# Patient Record
Sex: Male | Born: 1950
Health system: Southern US, Community
[De-identification: ages and names within clinical notes are randomized; demographics above are authoritative.]

## PROBLEM LIST (undated history)

## (undated) DIAGNOSIS — M1990 Unspecified osteoarthritis, unspecified site: Secondary | ICD-10-CM

## (undated) DIAGNOSIS — T7840XA Allergy, unspecified, initial encounter: Secondary | ICD-10-CM

## (undated) HISTORY — PX: OTHER SURGICAL HISTORY: SHX169

## (undated) HISTORY — DX: Unspecified osteoarthritis, unspecified site: M19.90

## (undated) HISTORY — DX: Allergy, unspecified, initial encounter: T78.40XA

---

## 2007-12-19 ENCOUNTER — Emergency Department (HOSPITAL_COMMUNITY): Admission: EM | Admit: 2007-12-19 | Discharge: 2007-12-19 | Payer: Self-pay | Admitting: Family Medicine

## 2009-10-16 HISTORY — PX: APPENDECTOMY: SHX54

## 2010-07-07 ENCOUNTER — Inpatient Hospital Stay (HOSPITAL_COMMUNITY): Admission: EM | Admit: 2010-07-07 | Discharge: 2010-07-10 | Payer: Self-pay | Admitting: Emergency Medicine

## 2010-07-07 ENCOUNTER — Encounter (INDEPENDENT_AMBULATORY_CARE_PROVIDER_SITE_OTHER): Payer: Self-pay | Admitting: Surgery

## 2010-11-06 ENCOUNTER — Encounter: Payer: Self-pay | Admitting: Surgery

## 2010-12-29 LAB — CBC
Hemoglobin: 12.8 g/dL — ABNORMAL LOW (ref 13.0–17.0)
Hemoglobin: 15 g/dL (ref 13.0–17.0)
MCH: 31.3 pg (ref 26.0–34.0)
MCH: 31.9 pg (ref 26.0–34.0)
MCHC: 35.1 g/dL (ref 30.0–36.0)
Platelets: 148 10*3/uL — ABNORMAL LOW (ref 150–400)
Platelets: 159 10*3/uL (ref 150–400)
Platelets: 181 10*3/uL (ref 150–400)
RBC: 3.9 MIL/uL — ABNORMAL LOW (ref 4.22–5.81)
RBC: 4.09 MIL/uL — ABNORMAL LOW (ref 4.22–5.81)
RBC: 4.79 MIL/uL (ref 4.22–5.81)
WBC: 11.9 10*3/uL — ABNORMAL HIGH (ref 4.0–10.5)
WBC: 24.7 10*3/uL — ABNORMAL HIGH (ref 4.0–10.5)

## 2010-12-29 LAB — COMPREHENSIVE METABOLIC PANEL
ALT: 32 U/L (ref 0–53)
AST: 26 U/L (ref 0–37)
Albumin: 4 g/dL (ref 3.5–5.2)
Alkaline Phosphatase: 55 U/L (ref 39–117)
CO2: 29 mEq/L (ref 19–32)
Chloride: 98 mEq/L (ref 96–112)
Creatinine, Ser: 1.61 mg/dL — ABNORMAL HIGH (ref 0.4–1.5)
GFR calc Af Amer: 53 mL/min — ABNORMAL LOW (ref >60)
GFR calc non Af Amer: 44 mL/min — ABNORMAL LOW (ref >60)
Potassium: 3.3 mEq/L — ABNORMAL LOW (ref 3.5–5.1)
Total Bilirubin: 1 mg/dL (ref 0.3–1.2)

## 2010-12-29 LAB — URINALYSIS, ROUTINE W REFLEX MICROSCOPIC
Ketones, ur: 15 mg/dL — AB
Nitrite: NEGATIVE
Specific Gravity, Urine: 1.021 (ref 1.005–1.030)

## 2010-12-29 LAB — DIFFERENTIAL
Basophils Absolute: 0 10*3/uL (ref 0.0–0.1)
Basophils Relative: 0 % (ref 0–1)
Basophils Relative: 0 % (ref 0–1)
Eosinophils Absolute: 0 10*3/uL (ref 0.0–0.7)
Eosinophils Absolute: 0 10*3/uL (ref 0.0–0.7)
Lymphocytes Relative: 8 % — ABNORMAL LOW (ref 12–46)
Lymphs Abs: 0.5 10*3/uL — ABNORMAL LOW (ref 0.7–4.0)
Lymphs Abs: 1 10*3/uL (ref 0.7–4.0)
Monocytes Absolute: 0.2 10*3/uL (ref 0.1–1.0)
Neutro Abs: 10.4 10*3/uL — ABNORMAL HIGH (ref 1.7–7.7)
Neutro Abs: 17.6 10*3/uL — ABNORMAL HIGH (ref 1.7–7.7)
Neutro Abs: 24 10*3/uL — ABNORMAL HIGH (ref 1.7–7.7)
Neutrophils Relative %: 88 % — ABNORMAL HIGH (ref 43–77)
Neutrophils Relative %: 97 % — ABNORMAL HIGH (ref 43–77)

## 2010-12-29 LAB — TYPE AND SCREEN
ABO/RH(D): A POS
Antibody Screen: NEGATIVE

## 2010-12-29 LAB — URINE MICROSCOPIC-ADD ON

## 2010-12-29 LAB — ANAEROBIC CULTURE

## 2010-12-29 LAB — ABO/RH: ABO/RH(D): A POS

## 2010-12-29 LAB — BASIC METABOLIC PANEL
CO2: 27 mEq/L (ref 19–32)
Calcium: 8.9 mg/dL (ref 8.4–10.5)
Creatinine, Ser: 1.29 mg/dL (ref 0.4–1.5)
GFR calc Af Amer: >60 mL/min (ref >60)
GFR calc non Af Amer: 57 mL/min — ABNORMAL LOW (ref >60)
Sodium: 139 mEq/L (ref 135–145)

## 2010-12-29 LAB — CULTURE, ROUTINE-ABSCESS

## 2010-12-29 LAB — PROTIME-INR: INR: 1.04 (ref 0.00–1.49)

## 2011-08-31 ENCOUNTER — Encounter: Payer: Self-pay | Admitting: Gastroenterology

## 2011-09-06 ENCOUNTER — Encounter: Payer: Self-pay | Admitting: Gastroenterology

## 2011-09-06 ENCOUNTER — Ambulatory Visit (AMBULATORY_SURGERY_CENTER): Payer: PRIVATE HEALTH INSURANCE | Admitting: *Deleted

## 2011-09-06 VITALS — Ht 68.0 in | Wt 218.1 lb

## 2011-09-06 DIAGNOSIS — Z1211 Encounter for screening for malignant neoplasm of colon: Secondary | ICD-10-CM

## 2011-09-06 MED ORDER — MOVIPREP 100 G PO SOLR: 1.0000 | Freq: Once | ORAL | Status: DC

## 2011-09-22 ENCOUNTER — Ambulatory Visit (AMBULATORY_SURGERY_CENTER): Payer: PRIVATE HEALTH INSURANCE | Admitting: Gastroenterology

## 2011-09-22 ENCOUNTER — Encounter: Payer: Self-pay | Admitting: Gastroenterology

## 2011-09-22 VITALS — BP 140/81 | HR 80 | Temp 97.6°F | Resp 21 | Ht 68.0 in | Wt 218.0 lb

## 2011-09-22 DIAGNOSIS — Z1211 Encounter for screening for malignant neoplasm of colon: Secondary | ICD-10-CM

## 2011-09-22 DIAGNOSIS — D126 Benign neoplasm of colon, unspecified: Secondary | ICD-10-CM

## 2011-09-22 MED ORDER — SODIUM CHLORIDE 0.9 % IV SOLN: 500.0000 mL | INTRAVENOUS | Status: DC

## 2011-09-22 NOTE — Patient Instructions (Signed)
Please follow all discharge instructions given to you by the recovery room nurse. If you have any questions or problems after discharge please call [phone]. You will receive a phone call in the am to see how you are doing and answer any questions you may have. Thank you for choosing [facility] for your health care needs.  

## 2011-09-22 NOTE — Progress Notes (Signed)
Pt tolerated the colonoscopy very well. maw 

## 2011-09-22 NOTE — Progress Notes (Signed)
Patient did not experience any of the following events: a burn prior to discharge; a fall within the facility; wrong site/side/patient/procedure/implant event; or a hospital transfer or hospital admission upon discharge from the facility. (G8907) Patient did not have preoperative order for IV antibiotic SSI prophylaxis. (G8918)  

## 2011-09-22 NOTE — Op Note (Signed)
[facility] [address] [place], [place]  [address]  COLONOSCOPY PROCEDURE REPORT  PATIENT:  Stanley, Estrada  MR#:  [id number] BIRTHDATE:  1951/03/27, 60 yrs. old  GENDER:  male ENDOSCOPIST:  Rachael Fee, MD PROCEDURE DATE:  09/22/2011 PROCEDURE:  Colonoscopy with snare polypectomy ASA CLASS:  Class II INDICATIONS:  Routine Risk Screening MEDICATIONS:   Fentanyl 50 mcg IV, These medications were titrated to patient response per physician's verbal order, Versed 6 mg IV  DESCRIPTION OF PROCEDURE:   After the risks benefits and alternatives of the procedure were thoroughly explained, informed consent was obtained.  Digital rectal exam was performed and revealed no rectal masses.   The LB [id number] endoscope was introduced through the anus and advanced to the cecum, which was identified by both the appendix and ileocecal valve, without limitations.  The quality of the prep was good..  The instrument was then slowly withdrawn as the colon was fully examined. <<PROCEDUREIMAGES>> FINDINGS:  Three polyps were found. Two were sessile, 2-106mm across, removed with cold snare, sent to pathology (jar 1), located in transverse segment. One was 10mm, pedunculated, removed with snare/cautery, sent to pathology (jar 2), located in distal sigmoid segment (see image4, image7, and image10).  This was otherwise a normal examination of the colon (see image11, image2, and image3).   Retroflexed views in the rectum revealed no abnormalities. COMPLICATIONS:  None  ENDOSCOPIC IMPRESSION: 1) Three polyps.  All were removed and all were sent to pathology 2) Otherwise normal examination  RECOMMENDATIONS: 1) If the polyp(s) removed today are proven to be adenomatous (pre-cancerous) polyps, you will need a colonoscopy in 3 years. Otherwise you should continue to follow colorectal cancer screening guidelines for "routine risk" patients with a colonoscopy in 10  years.  ______________________________ Rachael Fee, MD  n. eSIGNED:   Rachael Fee at 09/22/2011 11:23 AM  Floreen Comber, [phone]

## 2011-09-25 ENCOUNTER — Telehealth: Payer: Self-pay | Admitting: *Deleted

## 2011-09-25 NOTE — Telephone Encounter (Signed)
No answer, no voicemail on # pt left in adm. For [place] to call back.

## 2011-09-29 ENCOUNTER — Ambulatory Visit (INDEPENDENT_AMBULATORY_CARE_PROVIDER_SITE_OTHER): Payer: PRIVATE HEALTH INSURANCE | Admitting: Family Medicine

## 2011-09-29 ENCOUNTER — Encounter: Payer: Self-pay | Admitting: Family Medicine

## 2011-09-29 DIAGNOSIS — Z9109 Other allergy status, other than to drugs and biological substances: Secondary | ICD-10-CM

## 2011-09-29 DIAGNOSIS — K219 Gastro-esophageal reflux disease without esophagitis: Secondary | ICD-10-CM

## 2011-09-29 DIAGNOSIS — Z0000 Encounter for general adult medical examination without abnormal findings: Secondary | ICD-10-CM

## 2011-09-29 DIAGNOSIS — J309 Allergic rhinitis, unspecified: Secondary | ICD-10-CM

## 2011-09-29 MED ORDER — CETIRIZINE HCL 10 MG PO CAPS: 10.0000 mg | ORAL_CAPSULE | Freq: Every day | ORAL | Status: DC

## 2011-09-29 MED ORDER — OMEPRAZOLE 20 MG PO CPDR: 20.0000 mg | DELAYED_RELEASE_CAPSULE | Freq: Every day | ORAL | Status: DC

## 2011-09-29 NOTE — Patient Instructions (Signed)
Allergies, Generic Allergies may happen from anything your body is sensitive to. This may be food, medicines, pollens, chemicals, and nearly anything around you in everyday life that produces allergens. An allergen is anything that causes an allergy producing substance. Heredity is often a factor in causing these problems. This means you may have some of the same allergies as your parents. Food allergies happen in all age groups. Food allergies are some of the most severe and life threatening. Some common food allergies are cow's milk, seafood, eggs, nuts, wheat, and soybeans. SYMPTOMS   Swelling around the mouth.   An itchy red rash or hives.   Vomiting or diarrhea.   Difficulty breathing.  SEVERE ALLERGIC REACTIONS ARE LIFE-THREATENING. This reaction is called anaphylaxis. It can cause the mouth and throat to swell and cause difficulty with breathing and swallowing. In severe reactions only a trace amount of food (for example, peanut oil in a salad) may cause death within seconds. Seasonal allergies occur in all age groups. These are seasonal because they usually occur during the same season every year. They may be a reaction to molds, grass pollens, or tree pollens. Other causes of problems are house dust mite allergens, pet dander, and mold spores. The symptoms often consist of nasal congestion, a runny itchy nose associated with sneezing, and tearing itchy eyes. There is often an associated itching of the mouth and ears. The problems happen when you come in contact with pollens and other allergens. Allergens are the particles in the air that the body reacts to with an allergic reaction. This causes you to release allergic antibodies. Through a chain of events, these eventually cause you to release histamine into the blood stream. Although it is meant to be protective to the body, it is this release that causes your discomfort. This is why you were given anti-histamines to feel better. If you are  unable to pinpoint the offending allergen, it may be determined by skin or blood testing. Allergies cannot be cured but can be controlled with medicine. Hay fever is a collection of all or some of the seasonal allergy problems. It may often be treated with simple over-the-counter medicine such as diphenhydramine. Take medicine as directed. Do not drink alcohol or drive while taking this medicine. Check with your caregiver or package insert for child dosages. If these medicines are not effective, there are many new medicines your caregiver can prescribe. Stronger medicine such as nasal spray, eye drops, and corticosteroids may be used if the first things you try do not work well. Other treatments such as immunotherapy or desensitizing injections can be used if all else fails. Follow up with your caregiver if problems continue. These seasonal allergies are usually not life threatening. They are generally more of a nuisance that can often be handled using medicine. HOME CARE INSTRUCTIONS   If unsure what causes a reaction, keep a diary of foods eaten and symptoms that follow. Avoid foods that cause reactions.   If hives or rash are present:   Take medicine as directed.   You may use an over-the-counter antihistamine (diphenhydramine) for hives and itching as needed.   Apply cold compresses (cloths) to the skin or take baths in cool water. Avoid hot baths or showers. Heat will make a rash and itching worse.   If you are severely allergic:   Following a treatment for a severe reaction, hospitalization is often required for closer follow-up.   Wear a medic-alert bracelet or necklace stating the allergy.     You and your family must learn how to give adrenaline or use an anaphylaxis kit.   If you have had a severe reaction, always carry your anaphylaxis kit or EpiPen with you. Use this medicine as directed by your caregiver if a severe reaction is occurring. Failure to do so could have a fatal  outcome.  SEEK MEDICAL CARE IF:  You suspect a food allergy. Symptoms generally happen within 30 minutes of eating a food.   Your symptoms have not gone away within 2 days or are getting worse.   You develop new symptoms.   You want to retest yourself or your child with a food or drink you think causes an allergic reaction. Never do this if an anaphylactic reaction to that food or drink has happened before. Only do this under the care of a caregiver.  SEEK IMMEDIATE MEDICAL CARE IF:   You have difficulty breathing, are wheezing, or have a tight feeling in your chest or throat.   You have a swollen mouth, or you have hives, swelling, or itching all over your body.   You have had a severe reaction that has responded to your anaphylaxis kit or an EpiPen. These reactions may return when the medicine has worn off. These reactions should be considered life threatening.  MAKE SURE YOU:   Understand these instructions.   Will watch your condition.   Will get help right away if you are not doing well or get worse.  Document Released: 12/26/2002 Document Revised: 06/14/2011 Document Reviewed: 06/01/2008 [facility] Patient Information 2012 [place], [place].Gastroesophageal Reflux Disease, Adult Gastroesophageal reflux disease (GERD) happens when acid from your stomach flows up into the esophagus. When acid comes in contact with the esophagus, the acid causes soreness (inflammation) in the esophagus. Over time, GERD may create small holes (ulcers) in the lining of the esophagus. CAUSES   Increased body weight. This puts pressure on the stomach, making acid rise from the stomach into the esophagus.   Smoking. This increases acid production in the stomach.   Drinking alcohol. This causes decreased pressure in the lower esophageal sphincter (valve or ring of muscle between the esophagus and stomach), allowing acid from the stomach into the esophagus.   Late evening meals and a full stomach. This  increases pressure and acid production in the stomach.   A malformed lower esophageal sphincter.  Sometimes, no cause is found. SYMPTOMS   Burning pain in the lower part of the mid-chest behind the breastbone and in the mid-stomach area. This may occur twice a week or more often.   Trouble swallowing.   Sore throat.   Dry cough.   Asthma-like symptoms including chest tightness, shortness of breath, or wheezing.  DIAGNOSIS  Your caregiver may be able to diagnose GERD based on your symptoms. In some cases, X-rays and other tests may be done to check for complications or to check the condition of your stomach and esophagus. TREATMENT  Your caregiver may recommend over-the-counter or prescription medicines to help decrease acid production. Ask your caregiver before starting or adding any new medicines.  HOME CARE INSTRUCTIONS   Change the factors that you can control. Ask your caregiver for guidance concerning weight loss, quitting smoking, and alcohol consumption.   Avoid foods and drinks that make your symptoms worse, such as:   Caffeine or alcoholic drinks.   Chocolate.   Peppermint or mint flavorings.   Garlic and onions.   Spicy foods.   Citrus fruits, such as oranges, lemons, or limes.  Tomato-based foods such as sauce, chili, salsa, and pizza.   Fried and fatty foods.   Avoid lying down for the 3 hours prior to your bedtime or prior to taking a nap.   Eat small, frequent meals instead of large meals.   Wear loose-fitting clothing. Do not wear anything tight around your waist that causes pressure on your stomach.   Raise the head of your bed 6 to 8 inches with wood blocks to help you sleep. Extra pillows will not help.   Only take over-the-counter or prescription medicines for pain, discomfort, or fever as directed by your caregiver.   Do not take aspirin, ibuprofen, or other nonsteroidal anti-inflammatory drugs (NSAIDs).  SEEK IMMEDIATE MEDICAL CARE IF:   You  have pain in your arms, neck, jaw, teeth, or back.   Your pain increases or changes in intensity or duration.   You develop nausea, vomiting, or sweating (diaphoresis).   You develop shortness of breath, or you faint.   Your vomit is green, yellow, black, or looks like coffee grounds or blood.   Your stool is red, bloody, or black.  These symptoms could be signs of other problems, such as heart disease, gastric bleeding, or esophageal bleeding. MAKE SURE YOU:   Understand these instructions.   Will watch your condition.   Will get help right away if you are not doing well or get worse.  Document Released: 07/12/2005 Document Revised: 06/14/2011 Document Reviewed: 04/21/2011 [facility] Patient Information 2012 [place], [place].

## 2011-09-30 LAB — LIPID PANEL
HDL: 40 mg/dL (ref >39)
LDL Cholesterol: 106 mg/dL — ABNORMAL HIGH (ref 0–99)
Triglycerides: 336 mg/dL — ABNORMAL HIGH (ref <150)
VLDL: 67 mg/dL — ABNORMAL HIGH (ref 0–40)

## 2011-09-30 LAB — COMPREHENSIVE METABOLIC PANEL
ALT: 37 U/L (ref 0–53)
AST: 18 U/L (ref 0–37)
Creat: 1.06 mg/dL (ref 0.50–1.35)
Total Bilirubin: 0.4 mg/dL (ref 0.3–1.2)

## 2011-10-03 ENCOUNTER — Telehealth: Payer: Self-pay | Admitting: Family Medicine

## 2011-10-03 NOTE — Telephone Encounter (Signed)
Will fwd. To Dr.Newton for refill. Lorenda Hatchet, Renato Battles

## 2011-10-03 NOTE — Telephone Encounter (Signed)
Mr. Stanley Estrada requesting refill for Ventolin called to his pharmacy at [facility]. Is aware that original rx was prescribed by allergist, but need you to send to them now.

## 2011-10-04 ENCOUNTER — Other Ambulatory Visit: Payer: Self-pay | Admitting: Family Medicine

## 2011-10-04 MED ORDER — ALBUTEROL 90 MCG/ACT IN AERS: 1.0000 | INHALATION_SPRAY | Freq: Four times a day (QID) | RESPIRATORY_TRACT | Status: DC | PRN

## 2011-10-04 NOTE — Telephone Encounter (Signed)
Albuterol filled. Please let pt know. Thank you.

## 2011-10-06 ENCOUNTER — Encounter: Payer: Self-pay | Admitting: Family Medicine

## 2011-10-11 ENCOUNTER — Encounter (HOSPITAL_COMMUNITY): Payer: Self-pay

## 2011-10-11 ENCOUNTER — Emergency Department (INDEPENDENT_AMBULATORY_CARE_PROVIDER_SITE_OTHER)
Admission: EM | Admit: 2011-10-11 | Discharge: 2011-10-11 | Disposition: A | Discharge status: Home / Self Care | Payer: PRIVATE HEALTH INSURANCE | Source: Home / Self Care | Attending: Family Medicine | Admitting: Family Medicine

## 2011-10-11 DIAGNOSIS — J40 Bronchitis, not specified as acute or chronic: Secondary | ICD-10-CM

## 2011-10-11 MED ORDER — ALBUTEROL SULFATE (5 MG/ML) 0.5% IN NEBU
INHALATION_SOLUTION | RESPIRATORY_TRACT | Status: AC
  Filled 2011-10-11: qty 1

## 2011-10-11 MED ORDER — PREDNISONE 20 MG PO TABS: 40.0000 mg | ORAL_TABLET | Freq: Every day | ORAL | Status: AC

## 2011-10-11 MED ORDER — ALBUTEROL SULFATE (5 MG/ML) 0.5% IN NEBU
5.0000 mg | INHALATION_SOLUTION | Freq: Once | RESPIRATORY_TRACT | Status: AC
  Administered 2011-10-11: 5 mg via RESPIRATORY_TRACT

## 2011-10-11 MED ORDER — IPRATROPIUM BROMIDE 0.02 % IN SOLN
0.5000 mg | RESPIRATORY_TRACT | Status: DC
  Administered 2011-10-11 (×2): 0.5 mg via RESPIRATORY_TRACT

## 2011-10-11 NOTE — ED Provider Notes (Signed)
History     CSN: [id number]  Arrival date & time 10/11/11  1830   First MD Initiated Contact with Patient 10/11/11 1835      Chief Complaint  Patient presents with  . Cough    (Consider location/radiation/quality/duration/timing/severity/associated sxs/prior treatment) HPI Comments: Stanley Estrada presents for evaluation of persistent cough and wheezing over the last few days. He has been using albuterol without much relief. He reports fever yesterday. He denies smoking.   Patient is a 60 y.o. male presenting with cough. The history is provided by the patient.  Cough This is a new problem. The current episode started more than 2 days ago. The problem occurs constantly. The problem has not changed since onset.The cough is non-productive. Associated symptoms include shortness of breath and wheezing. Pertinent negatives include no chest pain. The treatment provided no relief. He is not a smoker. His past medical history is significant for asthma.    Past Medical History  Diagnosis Date  . Allergy   . Arthritis     DDD  . Asthma     Past Surgical History  Procedure Date  . Nasal polyps   . Nasal polyp surgery   . Appendectomy 2011    Family History  Problem Relation Age of Onset  . Colon cancer Neg Hx   . Esophageal cancer Neg Hx   . Stomach cancer Neg Hx   . Rectal cancer Neg Hx   . Hypertension Mother   . Diabetes Father   . Mental illness Son     developed s/p encephalitis infection     History  Substance Use Topics  . Smoking status: Former Games developer  . Smokeless tobacco: Former Neurosurgeon    Quit date: 09/29/1991  . Alcohol Use: No     2 drinks/ month      Review of Systems  Constitutional: Negative.   HENT: Negative.   Eyes: Negative.   Respiratory: Positive for cough, shortness of breath and wheezing.   Cardiovascular: Negative.  Negative for chest pain.  Gastrointestinal: Negative.   Genitourinary: Negative.   Musculoskeletal: Negative.   Skin: Negative.     Neurological: Negative.     Allergies  Review of patient's allergies indicates no known allergies.  Home Medications   Current Outpatient Rx  Name Route Sig Dispense Refill  . ALBUTEROL 90 MCG/ACT IN AERS Inhalation Inhale 1 puff into the lungs every 6 (six) hours as needed for wheezing. 17 g 0  . ASPIRIN PO Oral Take by mouth as needed.      Marland Kitchen CETIRIZINE HCL 10 MG PO CAPS Oral Take 1 capsule (10 mg total) by mouth daily. 30 capsule 11  . FLUTICASONE PROPIONATE 50 MCG/ACT NA SUSP  2 sprays into nose daily    . OMEPRAZOLE 20 MG PO CPDR Oral Take 1 capsule (20 mg total) by mouth daily. 30 capsule 1  . PREDNISONE 20 MG PO TABS Oral Take 2 tablets (40 mg total) by mouth daily. 10 tablet 0    BP 153/60  Pulse 50  Temp(Src) 99.2 F (37.3 C) (Oral)  Resp 26  SpO2 95%  Physical Exam  Nursing note and vitals reviewed. Constitutional: He is oriented to person, place, and time. He appears well-developed and well-nourished.  HENT:  Head: Normocephalic and atraumatic.  Eyes: EOM are normal.  Neck: Normal range of motion.  Pulmonary/Chest: Effort normal. He has wheezes in the right upper field, the right middle field, the right lower field, the left upper field, the left middle  field and the left lower field. He has no rhonchi.  Musculoskeletal: Normal range of motion.  Neurological: He is alert and oriented to person, place, and time.  Skin: Skin is warm and dry.  Psychiatric: His behavior is normal.    ED Course  Procedures (including critical care time)  Labs Reviewed - No data to display No results found.   1. Bronchitis       MDM  Steroid burst; continue albuterol        Richardo Priest, MD 10/11/11 2111

## 2011-10-11 NOTE — ED Notes (Signed)
Cough for 1 week, green-yellow secretions; scattered wheezing; NAD

## 2011-10-11 NOTE — ED Notes (Signed)
States feels better after tx, wheezing decreased

## 2011-10-23 NOTE — Progress Notes (Signed)
S:  Pt is here for new pt visit. Pt has no acute issue currently.    Reflux: Pt states that he has a history of reflux that he has been treated for in the past. This has been overall well controlled with prilosec. Pt denies any reflux flares.   Allergies: Pt also reports a longstanding history of allergic disease that he has taken flonase and zyrtec for. No acute flares recently. No reports of eczema in the past. Pt states that he has used albuterol on a prn basis in the past as he sometimes has wheezing in the spring secodnary to allergies. Pt states that he has not used this medication in the last 3 months.          O:  Current outpatient prescriptions:albuterol (PROVENTIL,VENTOLIN) 90 MCG/ACT inhaler, Inhale 1 puff into the lungs every 6 (six) hours as needed for wheezing., Disp: 17 g, Rfl: 0;  ASPIRIN PO, Take by mouth as needed.  , Disp: , Rfl: ;  Cetirizine HCl (ZYRTEC ALLERGY) 10 MG CAPS, Take 1 capsule (10 mg total) by mouth daily., Disp: 30 capsule, Rfl: 11;  fluticasone (FLONASE) 50 MCG/ACT nasal spray, 2 sprays into nose daily, Disp: , Rfl:  omeprazole (PRILOSEC) 20 MG capsule, Take 1 capsule (20 mg total) by mouth daily., Disp: 30 capsule, Rfl: 1  Wt Readings from Last 3 Encounters:  09/29/11 214 lb 4.8 oz (97.206 kg)  09/22/11 218 lb (98.884 kg)  09/06/11 218 lb 1.6 oz (98.93 kg)   Temp Readings from Last 3 Encounters:  10/11/11 99.2 F (37.3 C) Oral  09/22/11 97.6 F (36.4 C)    BP Readings from Last 3 Encounters:  10/11/11 153/60  09/29/11 132/82  09/22/11 140/81   Pulse Readings from Last 3 Encounters:  10/11/11 50  09/29/11 94  09/22/11 80    General: alert and cooperative HEENT: PERRLA and extra ocular movement intact Heart: S1, S2 normal, no murmur, rub or gallop, regular rate and rhythm Lungs: clear to auscultation, no wheezes or rales and unlabored breathing Abdomen: abdomen is soft without significant tenderness, masses, organomegaly or  guarding Extremities: extremities normal, atraumatic, no cyanosis or edema Skin:no rashes, no ecchymoses Neurology: normal without focal findings, mental status, speech normal, alert and oriented x3, PERLA and reflexes normal and symmetric   A/P:

## 2011-10-30 DIAGNOSIS — Z0000 Encounter for general adult medical examination without abnormal findings: Secondary | ICD-10-CM | POA: Insufficient documentation

## 2011-10-30 DIAGNOSIS — K219 Gastro-esophageal reflux disease without esophagitis: Secondary | ICD-10-CM | POA: Insufficient documentation

## 2011-10-30 DIAGNOSIS — J339 Nasal polyp, unspecified: Secondary | ICD-10-CM | POA: Insufficient documentation

## 2011-10-30 NOTE — Assessment & Plan Note (Signed)
Will refill zyrtec and flonase today. Will also fill albuterol. Broached issue of formal PFTs. Will address this at follow up visit.

## 2011-10-30 NOTE — Assessment & Plan Note (Signed)
PSA and lipids today. Pt states that he had a colonoscopy with [place] via Dr. Christella Hartigan with tubular adenomas x 2 (non high grade) 09/2011. Planned follow up colonoscopy in 3-5 years.

## 2011-10-30 NOTE — Assessment & Plan Note (Signed)
Refilled prilosec.

## 2011-11-01 ENCOUNTER — Other Ambulatory Visit: Payer: Self-pay | Admitting: Family Medicine

## 2011-11-01 NOTE — Telephone Encounter (Signed)
Refill request

## 2012-02-07 ENCOUNTER — Other Ambulatory Visit: Payer: Self-pay | Admitting: Family Medicine

## 2012-03-08 ENCOUNTER — Encounter (HOSPITAL_COMMUNITY): Payer: Self-pay | Admitting: Physical Medicine and Rehabilitation

## 2012-03-08 ENCOUNTER — Emergency Department (HOSPITAL_COMMUNITY)
Admission: EM | Admit: 2012-03-08 | Discharge: 2012-03-08 | Disposition: A | Discharge status: Other Acute Inpatient Hospital | Payer: Self-pay | Source: Home / Self Care | Attending: Emergency Medicine | Admitting: Emergency Medicine

## 2012-03-08 ENCOUNTER — Emergency Department (INDEPENDENT_AMBULATORY_CARE_PROVIDER_SITE_OTHER): Payer: BC Managed Care – PPO

## 2012-03-08 ENCOUNTER — Emergency Department (HOSPITAL_COMMUNITY)
Admission: EM | Admit: 2012-03-08 | Discharge: 2012-03-08 | Disposition: A | Discharge status: Home / Self Care | Payer: BC Managed Care – PPO | Attending: Emergency Medicine | Admitting: Emergency Medicine

## 2012-03-08 ENCOUNTER — Encounter (HOSPITAL_COMMUNITY): Payer: Self-pay

## 2012-03-08 DIAGNOSIS — M549 Dorsalgia, unspecified: Secondary | ICD-10-CM

## 2012-03-08 DIAGNOSIS — R319 Hematuria, unspecified: Secondary | ICD-10-CM

## 2012-03-08 DIAGNOSIS — R109 Unspecified abdominal pain: Secondary | ICD-10-CM | POA: Insufficient documentation

## 2012-03-08 LAB — POCT URINALYSIS DIP (DEVICE)
Leukocytes, UA: NEGATIVE
Nitrite: NEGATIVE
Protein, ur: 30 mg/dL — AB
Urobilinogen, UA: 0.2 mg/dL (ref 0.0–1.0)
pH: 5.5 (ref 5.0–8.0)

## 2012-03-08 MED ORDER — HYDROCODONE-ACETAMINOPHEN 5-325 MG PO TABS
ORAL_TABLET | ORAL | Status: AC
  Filled 2012-03-08: qty 2

## 2012-03-08 MED ORDER — HYDROCODONE-ACETAMINOPHEN 5-325 MG PO TABS
2.0000 | ORAL_TABLET | Freq: Once | ORAL | Status: AC
  Administered 2012-03-08: 2 via ORAL

## 2012-03-08 NOTE — ED Notes (Signed)
Had 3 h car trip yesterday, woke this AM , felt good, and after moving around a bit, had sudden onset of pain left CVJ , denies radiation of pain; pain not changed w percussion of CVJ. Color good, w.d, NAD

## 2012-03-08 NOTE — ED Provider Notes (Signed)
History     CSN: [id number]  Arrival date & time 03/08/12  1341   First MD Initiated Contact with Patient 03/08/12 1352      Chief Complaint  Patient presents with  . Back Pain    (Consider location/radiation/quality/duration/timing/severity/associated sxs/prior treatment) HPI Comments: The left lower back pain( points to paravertebral area) have established within the last 10 hours. Patient denies any further symptomatology: Urinary symptoms. Had a 3 hour car trip yesterday woke up this morning with significant discomfort movement makes it wqorse and he feels this particular spot on his left lower back that has intensified and the last couple of hours. Patient denies any nausea, vomiting fevers or urinary symptoms associated with it. No recent falls or injuries   Patient is a 61 y.o. male presenting with back pain. The history is provided by the patient.  Back Pain  This is a new problem. The current episode started 12 to 24 hours ago. The problem occurs constantly. The problem has been gradually worsening. The pain is associated with twisting (movement and palpation). The pain is present in the lumbar spine. The pain is at a severity of 5/10. The pain is moderate. The symptoms are aggravated by bending, twisting and certain positions. The pain is worse during the day. Pertinent negatives include no fever, no numbness, no weight loss, no headaches, no abdominal pain, no bowel incontinence, no perianal numbness, no bladder incontinence, no dysuria, no pelvic pain, no leg pain, no paresthesias, no paresis, no tingling and no weakness. He has tried NSAIDs for the symptoms. The treatment provided no relief.    Past Medical History  Diagnosis Date  . Allergy   . Arthritis     DDD  . Asthma     Past Surgical History  Procedure Date  . Nasal polyps   . Nasal polyp surgery   . Appendectomy 2011    Family History  Problem Relation Age of Onset  . Colon cancer Neg Hx   . Esophageal  cancer Neg Hx   . Stomach cancer Neg Hx   . Rectal cancer Neg Hx   . Hypertension Mother   . Diabetes Father   . Mental illness Son     developed s/p encephalitis infection     History  Substance Use Topics  . Smoking status: Former Games developer  . Smokeless tobacco: Former Neurosurgeon    Quit date: 09/29/1991  . Alcohol Use: No     2 drinks/ month      Review of Systems  Constitutional: Negative for fever, chills, weight loss, diaphoresis, activity change and appetite change.  Gastrointestinal: Negative for nausea, abdominal pain and bowel incontinence.  Genitourinary: Negative for bladder incontinence, dysuria, frequency, hematuria, scrotal swelling, penile pain, testicular pain and pelvic pain.  Musculoskeletal: Positive for back pain.  Neurological: Negative for tingling, weakness, numbness, headaches and paresthesias.    Allergies  Review of patient's allergies indicates no known allergies.  Home Medications   Current Outpatient Rx  Name Route Sig Dispense Refill  . ASPIRIN PO Oral Take by mouth as needed.      Marland Kitchen CETIRIZINE HCL 10 MG PO CAPS Oral Take 1 capsule (10 mg total) by mouth daily. 30 capsule 11  . FLUTICASONE PROPIONATE 50 MCG/ACT NA SUSP  2 sprays into nose daily    . OMEPRAZOLE 20 MG PO CPDR Oral Take 1 capsule (20 mg total) by mouth daily. 30 capsule 1  . VENTOLIN HFA 108 (90 BASE) MCG/ACT IN AERS  INHALE  1 PUFF BY MOUTH EVERY 6 HOURS AS NEEDED FOR WHEEZING 18 g 6    BP 160/108  Pulse 82  Temp(Src) 98 F (36.7 C) (Oral)  Resp 20  SpO2 96%  Physical Exam  Nursing note and vitals reviewed. Constitutional: He appears well-developed and well-nourished.  Eyes: Conjunctivae are normal.  Neck: Neck supple.  Pulmonary/Chest: Effort normal. No respiratory distress.  Abdominal: Soft. He exhibits no distension. There is no tenderness.  Musculoskeletal: He exhibits tenderness. He exhibits no edema.       Lumbar back: He exhibits tenderness and pain. He exhibits  normal range of motion, no bony tenderness, no swelling, no edema, no deformity, no spasm and normal pulse.       Back:  Neurological: He is alert.    ED Course  Procedures (including critical care time)  Labs Reviewed  POCT URINALYSIS DIP (DEVICE) - Abnormal; Notable for the following:    Hgb urine dipstick MODERATE (*)    Protein, ur 30 (*)    All other components within normal limits   No results found.   No diagnosis found.    MDM  The left paravertebral pain and have established within the 10 hours. Patient denies any further symptomatology: Urinary symptoms. Had a 3 hour car trip yesterday woke up this morning with significant discomfort movement makes it better and he feels this particular spot on his left lower back that has intensified and the last couple of hours. Patient denies any nausea, vomiting fevers or urinary symptoms associated with it.       Jimmie Molly, MD 03/08/12 [phone]

## 2012-03-08 NOTE — ED Notes (Addendum)
Pt presents to department from [facility] for evaluation of L sided flank pain. Pt states onset of pain this morning, now 5/10 constant "dull" sensation. Pain becomes worse with movement. Urine testing show moderate hemoglobin. No history of kidney stones. No nausea/vomiting, denies fever.  Pt is conscious alert and oriented x4. No signs of acute distress at the time.

## 2012-04-02 ENCOUNTER — Other Ambulatory Visit: Payer: Self-pay | Admitting: Family Medicine

## 2012-08-23 ENCOUNTER — Encounter: Payer: Self-pay | Admitting: Family Medicine

## 2012-08-23 NOTE — Progress Notes (Signed)
Patient ID: Stanley Estrada, male   DOB: Jan 13, 1951, 61 y.o.   MRN: [id number] I have received request for surgical clearance for this patient. I replied with a fax stating that he hasnt been seen since 09/2011 and would need an appointment for clearance.

## 2012-08-27 ENCOUNTER — Ambulatory Visit (INDEPENDENT_AMBULATORY_CARE_PROVIDER_SITE_OTHER): Payer: BC Managed Care – PPO | Admitting: Family Medicine

## 2012-08-27 ENCOUNTER — Encounter: Payer: Self-pay | Admitting: Family Medicine

## 2012-08-27 ENCOUNTER — Ambulatory Visit
Admission: RE | Admit: 2012-08-27 | Discharge: 2012-08-27 | Disposition: A | Discharge status: Home / Self Care | Payer: BC Managed Care – PPO | Source: Ambulatory Visit | Attending: Family Medicine | Admitting: Family Medicine

## 2012-08-27 VITALS — BP 142/86 | HR 79 | Ht 68.0 in | Wt 217.0 lb

## 2012-08-27 DIAGNOSIS — R030 Elevated blood-pressure reading, without diagnosis of hypertension: Secondary | ICD-10-CM

## 2012-08-27 DIAGNOSIS — J339 Nasal polyp, unspecified: Secondary | ICD-10-CM

## 2012-08-27 DIAGNOSIS — R0602 Shortness of breath: Secondary | ICD-10-CM

## 2012-08-27 NOTE — Patient Instructions (Addendum)
It was a pleasure to see you today. I see no medical reason why you cannot have the nasal polyp surgery as you have planned, with Dr Nelly Rout of [facility].  Given your recent use of albuterol, I am ordering a chest xray to be done as an outpatient at [facility].  Refrain from using the aspirin for 7 days prior to the surgery.   As an item not related to your preoperative evaluation, I recommend we schedule you for Spirometry testing in our office, to better understand your need for albuterol.  I recommend follow-up for this with Dr. Ermalinda Memos, your primary physician.   FOLLOW UP WITH DR Ermalinda Memos IN THE COMING 4 WEEKS.  APPOINTMENT FOR SPIROMETRY WITH PHARMACY CLINIC IN [facility] NEXT AVAILABLE

## 2012-08-27 NOTE — Assessment & Plan Note (Signed)
Patient with nasal polyps, seen by Dr. Nelly Rout (ENT in [place]), who has scheduled the patient for surgery (polypectomy) on Nov 19th per patient report.  I believe the patient is medically optimized for surgery from my standpoint, and he does not require any additional workup/testing prior to surgery.  Of note, he appears to have some reactive airways disease by history which is reversible with short-acting beta agonist.  We will test him with spirometry in this office at his convenience, but I do not believe this should hold up his scheduled surgery.  He reports that he has taken aspirin on occasion for aches/pains, but I have asked him to hold off the aspirin for 7 to 10 days prior to the surgery.

## 2012-08-27 NOTE — Assessment & Plan Note (Signed)
History of dyspnea that is reversible with albuterol, worsening by cold air and dust, exertion.  Suspect element of asthma; will ask patient to come back for Spirometry testing to make more proper diagnosis of RAD. I am ordering a chest x-ray as well, although I do not believe this workup (or the CXR) needs to hold up his plans for nasal polyp surgery.

## 2012-08-27 NOTE — Progress Notes (Signed)
  Subjective:    Patient ID: Stanley Estrada, male    DOB: Sep 05, 1951, 61 y.o.   MRN: [id number]  HPI Patient seen here today for medical evaluation prior to scheduled nasal polypectomy with ENT Dr. Nelly Rout of [facility] for Nov 19th, 2013.  He has noticed worsening nasal obstruction symptoms that are not adequately resolved with daily flonase use.   Has had similar surgery around 30 years ago that was successful.   Of note, he is a former smoker who quit smoking 30 years ago.   He reports that he has had worsening dyspnea that feels like his upper airway is becoming tight; resolves with albuterol HFA< which he has taken to using daily in the past few weeks.  He notes that this symptom is worsened by dust, after meals, and cold weather as well as exertion.  It is not associated with chest tightness or radiating discomfort to arm or jaw.  He does not have a history of CAD.    Surgical Hx: nasal polypectomy 30 yrs ago as mentioned above.  Also, s/p appy in 2011.  Never had intolerance or adverse effects with anesthesia.   NKDA upon my review with the patient.   Social Hx; Former smoker over 30 yrs ago. 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  Review of Systems  Denies recent fevers or chills; no chest pain.  Has had intermittent upper airway tightness that resolves completely with use of albuterol HFA; never has had a formal diagnosis of asthma.   He reports that he is able to climb 2 flights of stairs without becoming overly winded.  He does complain of some bilateral knee pain that makes prolonged walking painful.      Objective:   Physical Exam Well appearing, no apparent distress HEENT neck supple. No cervical adenopathy Boggy nasal mucosa. No frontal or maxillary sinus tenderness on palpation.  COR Regular S1S2 PULM Clear bilaterally, no rales or wheezes ABD Soft, nontender.  LEs no edema.       Assessment & Plan:

## 2012-08-28 ENCOUNTER — Telehealth: Payer: Self-pay | Admitting: Family Medicine

## 2012-08-28 NOTE — Telephone Encounter (Signed)
Called patient to report negative CXR.  He states she saw Dr. Haroldine Laws yesterday and will have ENT surgery done there.  Would like his records sent to Dr Brion Aliment instead of Dr. Nelly Rout [facility]).   I will ask our admin staff to send yesterday's office notes to Dr Hermelinda Medicus:  Dr. Gasper Lloyd. [place] [address], [place] [address] Main: [phone]

## 2012-09-06 ENCOUNTER — Encounter: Payer: Self-pay | Admitting: Family Medicine

## 2012-10-04 ENCOUNTER — Other Ambulatory Visit: Payer: Self-pay | Admitting: Otolaryngology

## 2012-10-04 HISTORY — PX: NASAL POLYP SURGERY: SHX186

## 2012-10-11 ENCOUNTER — Encounter: Payer: Self-pay | Admitting: Family Medicine

## 2012-10-25 ENCOUNTER — Ambulatory Visit: Payer: BC Managed Care – PPO | Admitting: Cardiology

## 2012-10-29 ENCOUNTER — Ambulatory Visit (INDEPENDENT_AMBULATORY_CARE_PROVIDER_SITE_OTHER): Payer: PRIVATE HEALTH INSURANCE | Admitting: Cardiology

## 2012-10-29 ENCOUNTER — Encounter: Payer: Self-pay | Admitting: Cardiology

## 2012-10-29 VITALS — BP 150/90 | HR 69 | Resp 18 | Ht 68.0 in | Wt 217.0 lb

## 2012-10-29 DIAGNOSIS — R0602 Shortness of breath: Secondary | ICD-10-CM

## 2012-10-29 DIAGNOSIS — Z8679 Personal history of other diseases of the circulatory system: Secondary | ICD-10-CM | POA: Insufficient documentation

## 2012-10-29 DIAGNOSIS — E7800 Pure hypercholesterolemia, unspecified: Secondary | ICD-10-CM

## 2012-10-29 DIAGNOSIS — I119 Hypertensive heart disease without heart failure: Secondary | ICD-10-CM

## 2012-10-29 MED ORDER — LISINOPRIL 10 MG PO TABS: 10.0000 mg | ORAL_TABLET | Freq: Every day | ORAL | Status: DC

## 2012-10-29 NOTE — Patient Instructions (Addendum)
START LISINOPRIL 10 MG DAILY, RX SENT TO [facility]  Your physician recommends that you schedule a follow-up appointment in:1 months with fasting labs (LP/BMET/HFP)

## 2012-10-29 NOTE — Progress Notes (Signed)
Stanley Estrada Date of Birth:  05-29-1951 [facility] [address] [place], [place]  [address] [phone]         Fax   [phone]  History of Present Illness: This pleasant 63 year old gentleman is seen at the request of Dr. Hermelinda Medicus.  This patient recently underwent sensitive sinus surgery on 10/04/12.  He was observed overnight and during his overnight observation he was noted to have episodes of transient bradycardia.  These episodes were asymptomatic.  The patient does not have any history of known heart disease.  He does give a history of mild hypertension in the past.  He had a lipid panel a year ago which showed modest elevations of cholesterol and LDL.  He does not have any history of premature coronary disease.  He does not have any history of exertional chest pain.  He denies any history of dizziness or syncope or blackouts.  He does not have any history to suggest congestive heart failure.  He had a preoperative chest x-ray 08/27/12 which showed normal heart size and clear lungs. The patient does have a past history of nasal polyps and a history of allergy.  He is presently taking Qvar spray 2 sprays twice a day for asthma prevention.  He has Ventolin on hand but has not had to use it and he is not presently using Nasonex spray either.  The patient has been a nonsmoker for more than 20 years.  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 albuterol (PROVENTIL HFA;VENTOLIN HFA) 108 (90 BASE) MCG/ACT inhaler Inhale 2 puffs into the lungs every 6 (six) hours as needed. For shortness of breath      . beclomethasone (QVAR) 40 MCG/ACT inhaler Inhale 2 puffs into the lungs 2 (two) times daily.      . fluticasone (FLONASE) 50 MCG/ACT nasal spray USE 2 SPRAYS IN  EACH NOSTRIL EVERYDAY  16 g  6  . lisinopril (PRINIVIL) 10 MG tablet Take 1 tablet (10 mg total) by mouth daily.  30 tablet  5    No Known Allergies  Patient Active Problem List  Diagnosis  . Nasal polyps  . GERD (gastroesophageal reflux disease)  . Annual physical exam  . Shortness of breath  . Elevated blood pressure reading without diagnosis of hypertension  . Benign hypertensive heart disease without heart failure    History  Smoking status  . Former Smoker  Smokeless tobacco  . Former Neurosurgeon  . Quit date: 09/29/1991    History  Alcohol Use No    Comment: 2 drinks/ month    Family History  Problem Relation Age of Onset  . Colon cancer Neg Hx   . Esophageal cancer Neg Hx   . Stomach cancer Neg Hx   . Rectal cancer Neg Hx   . Hypertension Mother   . Diabetes Father   . Mental illness Son     developed s/p encephalitis infection     Review of Systems: Constitutional: no fever chills diaphoresis or fatigue or change in weight.  Head and neck: no hearing loss, no epistaxis, no photophobia or visual disturbance. Respiratory: No cough, shortness of breath or wheezing. Cardiovascular: No chest pain peripheral edema, palpitations. Gastrointestinal: No abdominal distention, no abdominal pain, no change in bowel habits  hematochezia or melena. Genitourinary: No dysuria, no frequency, no urgency, no nocturia. Musculoskeletal:No arthralgias, no back pain, no gait disturbance or myalgias. Neurological: No dizziness, no headaches, no numbness, no seizures, no syncope, no weakness, no tremors. Hematologic: No lymphadenopathy, no easy bruising. Psychiatric: No confusion, no hallucinations, no sleep disturbance.    Physical Exam: Filed Vitals:   10/29/12 1043  BP: 150/90  Pulse: 69  Resp:    the general appearance reveals a somewhat overweight gentleman in acute distress.The head and neck exam reveals pupils equal and reactive.  Extraocular movements are full.  There is no  scleral icterus.  The mouth and pharynx are normal.  The neck is supple.  The carotids reveal no bruits.  The jugular venous pressure is normal.  The  thyroid is not enlarged.  There is no lymphadenopathy.  The chest is clear to percussion and auscultation.  There are no rales or rhonchi.  Expansion of the chest is symmetrical.  The precordium is quiet.  The first heart sound is normal.  The second heart sound is physiologically split.  There is no murmur gallop rub or click.  There is no abnormal lift or heave.  The abdomen is soft and nontender.  The bowel sounds are normal.  The liver and spleen are not enlarged.  There are no abdominal masses.  There are no abdominal bruits.  Extremities reveal good pedal pulses.  There is no phlebitis or edema.  There is no cyanosis or clubbing.  Strength is normal and symmetrical in all extremities.  There is no lateralizing weakness.  There are no sensory deficits.  The skin is warm and dry.  There is no rash.  EKG today shows normal sinus rhythm and is within normal limits   Assessment / Plan: The patient does not give any history to suggest symptomatic bradycardia arrhythmias.  His episode postoperatively may have been related to the pain medication that he received.  At this point there is not sufficient reason to have him wear a event monitor unless he begins having symptoms.  We are going to treat his hypertension with lisinopril 10 mg daily and we have advised weight loss and regular exercise and dietary salt restriction.  The patient will return in one month for followup office visit at which time we will check fasting lipids and chemistries Many thanks for the opportunity to see this pleasant gentleman with you

## 2012-10-29 NOTE — Assessment & Plan Note (Signed)
Blood pressure here in the office was elevated.  I rechecked it myself and it had come down to 150/90.  The patient has not been attempting to limit his salt intake but he does not go over for consult either.  He has had some mild exertional dyspnea and we will plan to begin lisinopril 10 mg one daily for blood pressure.

## 2012-10-29 NOTE — Assessment & Plan Note (Signed)
The patient had a lipid panel done in December 2012 which showed elevation of his lipids.  He is not on lipid-lowering agents.  We will plan to have him return in a month for followup office visit and at that time we will plan to get fasting lipids, hepatic function panel and basal metabolic panel.

## 2012-10-29 NOTE — Assessment & Plan Note (Signed)
His shortness of breath is felt to be multifactorial including being overweight and having mild high blood pressure.  His previous nasal polyps were also playing a role.  At the present time his asthma appears to be well controlled and he is not wheezing.

## 2012-12-06 ENCOUNTER — Ambulatory Visit: Payer: PRIVATE HEALTH INSURANCE | Admitting: Cardiology

## 2012-12-06 ENCOUNTER — Other Ambulatory Visit: Payer: PRIVATE HEALTH INSURANCE

## 2013-02-07 ENCOUNTER — Encounter: Payer: Self-pay | Admitting: Emergency Medicine

## 2013-02-07 ENCOUNTER — Ambulatory Visit (INDEPENDENT_AMBULATORY_CARE_PROVIDER_SITE_OTHER): Payer: PRIVATE HEALTH INSURANCE | Admitting: Emergency Medicine

## 2013-02-07 VITALS — BP 131/85 | HR 81 | Temp 97.8°F | Resp 18 | Ht 69.0 in | Wt 213.0 lb

## 2013-02-07 DIAGNOSIS — J45909 Unspecified asthma, uncomplicated: Secondary | ICD-10-CM | POA: Insufficient documentation

## 2013-02-07 DIAGNOSIS — Z0000 Encounter for general adult medical examination without abnormal findings: Secondary | ICD-10-CM

## 2013-02-07 DIAGNOSIS — I119 Hypertensive heart disease without heart failure: Secondary | ICD-10-CM

## 2013-02-07 DIAGNOSIS — M1711 Unilateral primary osteoarthritis, right knee: Secondary | ICD-10-CM | POA: Insufficient documentation

## 2013-02-07 DIAGNOSIS — M1710 Unilateral primary osteoarthritis, unspecified knee: Secondary | ICD-10-CM

## 2013-02-07 LAB — COMPREHENSIVE METABOLIC PANEL
Albumin: 4.4 g/dL (ref 3.5–5.2)
Alkaline Phosphatase: 47 U/L (ref 39–117)
CO2: 27 mEq/L (ref 19–32)
Calcium: 9.8 mg/dL (ref 8.4–10.5)
Chloride: 102 mEq/L (ref 96–112)
Glucose, Bld: 105 mg/dL — ABNORMAL HIGH (ref 70–99)
Potassium: 4.5 mEq/L (ref 3.5–5.3)
Sodium: 138 mEq/L (ref 135–145)
Total Protein: 7.1 g/dL (ref 6.0–8.3)

## 2013-02-07 LAB — POCT GLYCOSYLATED HEMOGLOBIN (HGB A1C): Hemoglobin A1C: 6

## 2013-02-07 LAB — LIPID PANEL: Cholesterol: 220 mg/dL — ABNORMAL HIGH (ref 0–200)

## 2013-02-07 NOTE — Assessment & Plan Note (Signed)
Doing well.  Using tylenol as needed.

## 2013-02-07 NOTE — Assessment & Plan Note (Addendum)
Well controlled.  Continue lisinopril 10mg  daily, okay to take 1/2 tablet.

## 2013-02-07 NOTE — Assessment & Plan Note (Signed)
Doing very well with QVAR.  Only needs albuterol with yard work or heavy activity.

## 2013-02-07 NOTE — Patient Instructions (Addendum)
It was nice to meet you! Everything looks good today. We will check the lab work for the insurance form today. I will get those results on Monday and have the form ready for you to pick up on Tuesday, the office will call you. I will mail you a letter with your results or call if anything is wrong. Follow up in 6 months to follow your blood pressure.

## 2013-02-07 NOTE — Assessment & Plan Note (Signed)
Doing well today.  Will check screening labs for insurance form.  Discussed weight loss and activities that would be good for arthritis.

## 2013-02-07 NOTE — Progress Notes (Signed)
  Subjective:    Patient ID: Stanley Estrada, male    DOB: 06/12/1951, 62 y.o.   MRN: [id number]  HPI Stanley Estrada is here for an annual exam.  I have reviewed and updated the following as appropriate: allergies, current medications, past family history, past medical history, past social history, past surgical history and problem list. - Asthma: well controlled since starting QVAR. Only using albuterol if he is doing heavy yard work. - HTN: well controlled.  States sometimes he will only take 1/2 a tablet of lisinopril as it sometimes makes him dizzy - Arthritis: doing well.  Takes tylenol as needed but not very often. SHx: former smoker  Review of Systems  Constitutional: Negative.   HENT: Negative.   Eyes: Negative.   Respiratory: Negative.   Cardiovascular: Negative.   Gastrointestinal: Negative.   Endocrine: Negative.   Genitourinary: Negative.   Neurological: Negative.        Objective:   Physical Exam BP 131/85  Pulse 81  Temp(Src) 97.8 F (36.6 C) (Oral)  Resp 18  Ht 5\' 9"  (1.753 m)  Wt 213 lb (96.616 kg)  BMI 31.44 kg/m2  SpO2 97% Gen: alert, cooperative, NAD HEENT: AT/[place], sclera white, MMM, PERRL Neck: supple, no LAD CV: RRR, no murmurs Pulm: CTAB, no wheezes or rales Abd: +BS, soft, NTND Ext: no edema, 2+ DP pulses bilaterally Neuro: gait normal; normal tone     Assessment & Plan:

## 2013-02-10 ENCOUNTER — Encounter: Payer: Self-pay | Admitting: Emergency Medicine

## 2013-03-19 IMAGING — CR DG CHEST 2V
2 series · 2 of 2 positions shown · non-contrast
Comparison: Chest radiograph 12/19/2007

CLINICAL DATA: Short of breath of

CHEST - 2 VIEW

[w chest pa]
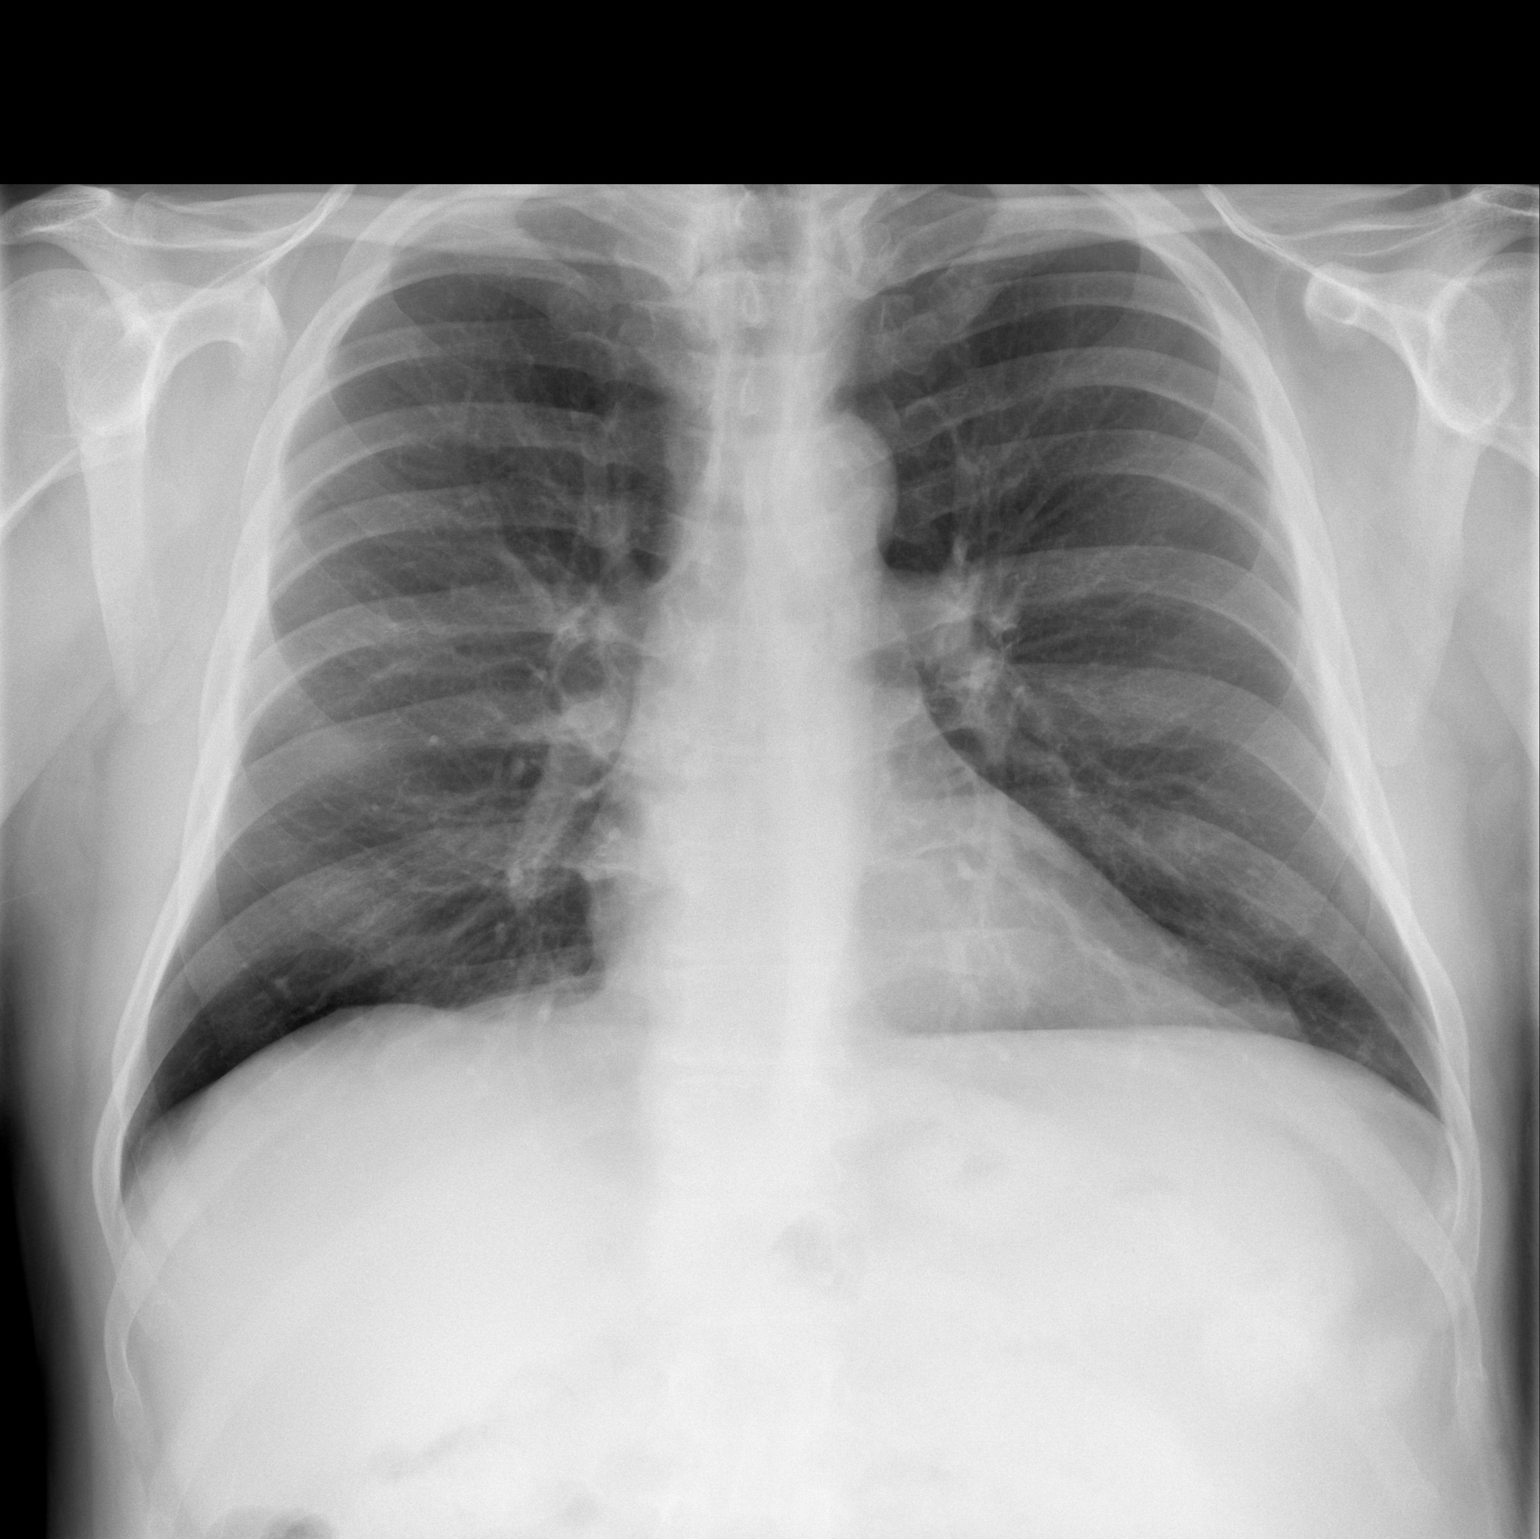

[w chest lat]
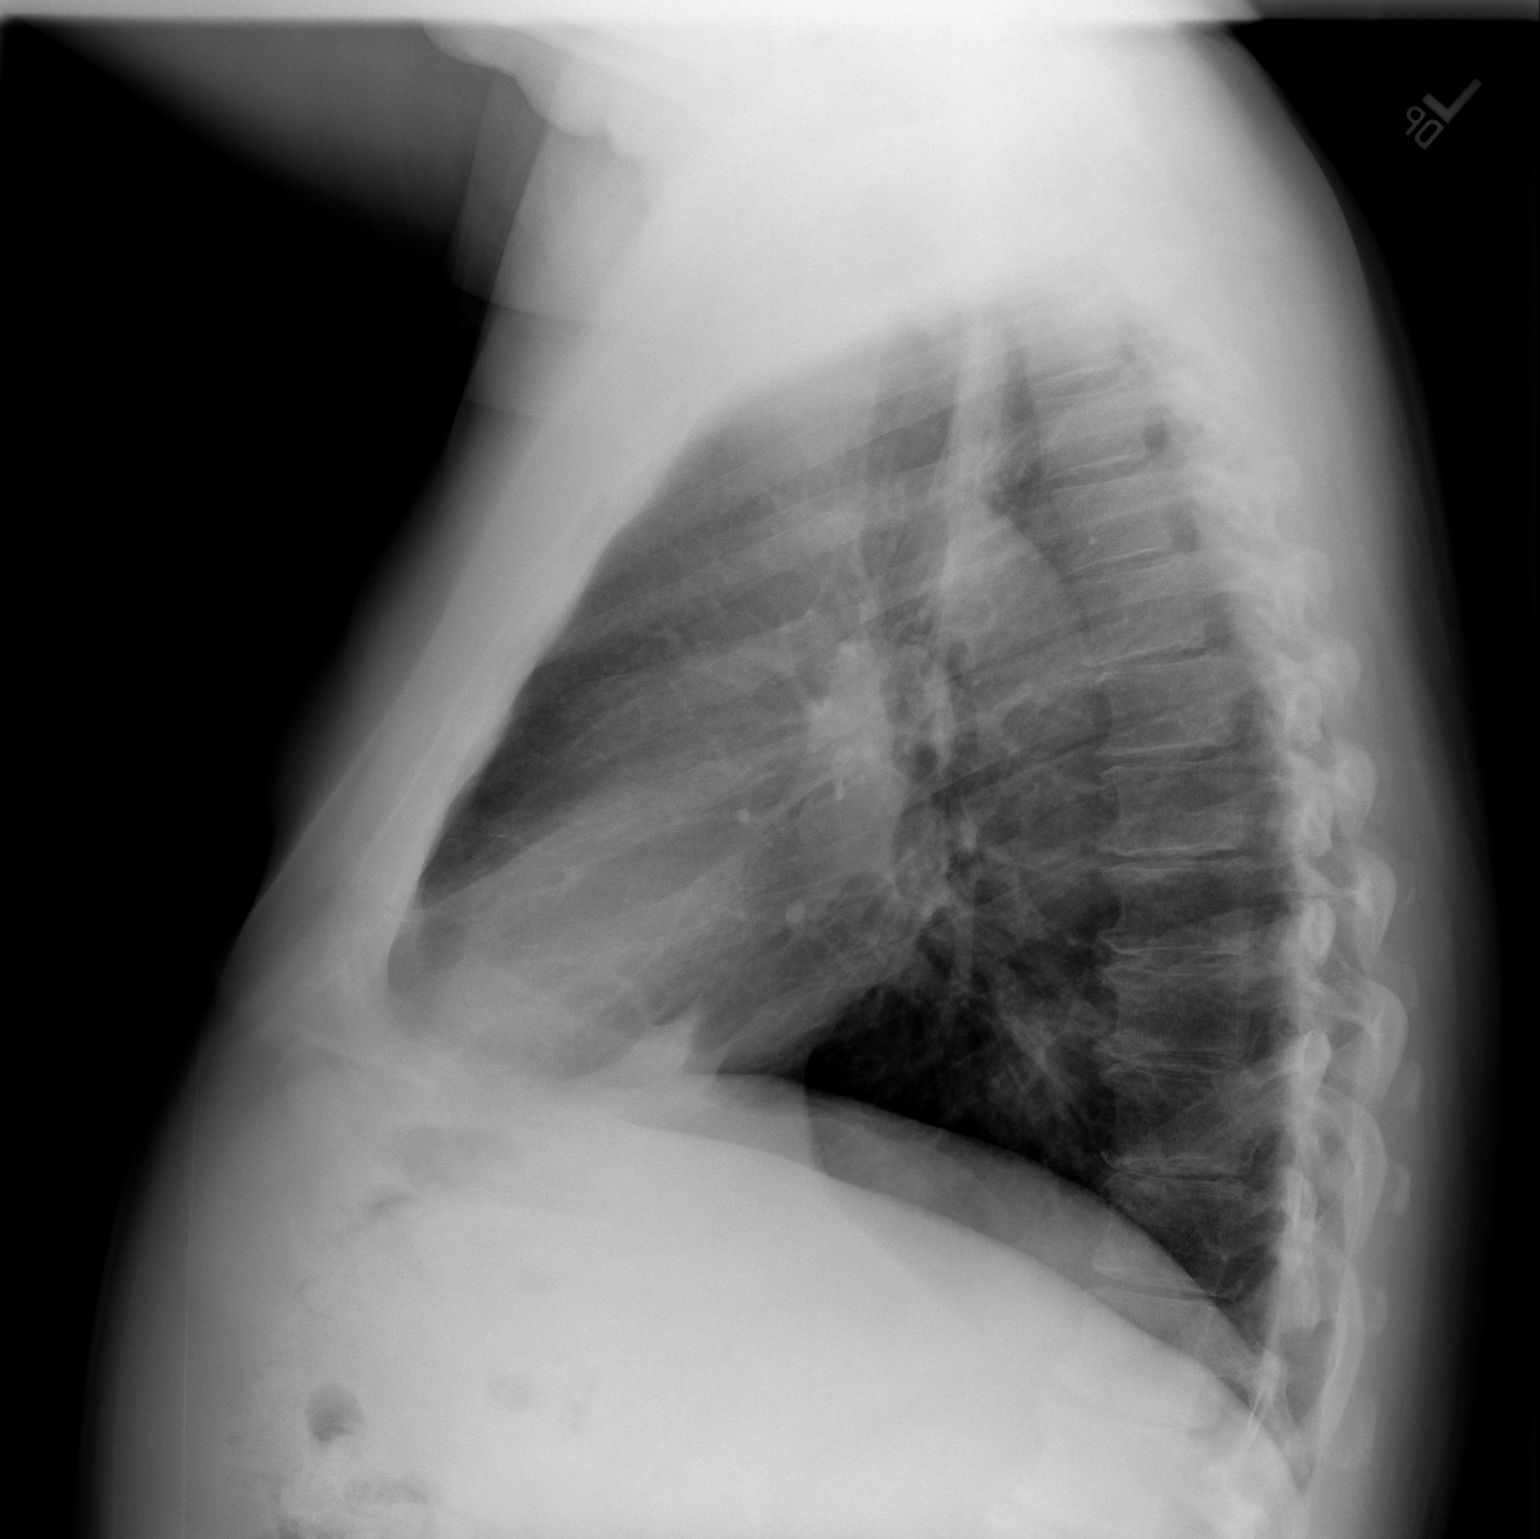

[2 of 2 positions shown; findings below may reference images not displayed]

FINDINGS: Normal mediastinum and cardiac silhouette.  Normal
pulmonary  vasculature.  No evidence of effusion, infiltrate, or
pneumothorax.  No acute bony abnormality. Degenerative
osteophytosis of the thoracic spine.
IMPRESSION: No acute cardiopulmonary process.

## 2013-12-08 ENCOUNTER — Ambulatory Visit (INDEPENDENT_AMBULATORY_CARE_PROVIDER_SITE_OTHER): Payer: 59 | Admitting: Emergency Medicine

## 2013-12-08 ENCOUNTER — Encounter: Payer: Self-pay | Admitting: Emergency Medicine

## 2013-12-08 VITALS — BP 128/81 | HR 80 | Ht 68.0 in | Wt 219.9 lb

## 2013-12-08 DIAGNOSIS — M1710 Unilateral primary osteoarthritis, unspecified knee: Secondary | ICD-10-CM

## 2013-12-08 DIAGNOSIS — J339 Nasal polyp, unspecified: Secondary | ICD-10-CM

## 2013-12-08 DIAGNOSIS — IMO0002 Reserved for concepts with insufficient information to code with codable children: Secondary | ICD-10-CM

## 2013-12-08 DIAGNOSIS — Z0000 Encounter for general adult medical examination without abnormal findings: Secondary | ICD-10-CM

## 2013-12-08 DIAGNOSIS — M1711 Unilateral primary osteoarthritis, right knee: Secondary | ICD-10-CM

## 2013-12-08 DIAGNOSIS — J45909 Unspecified asthma, uncomplicated: Secondary | ICD-10-CM

## 2013-12-08 DIAGNOSIS — Z8679 Personal history of other diseases of the circulatory system: Secondary | ICD-10-CM

## 2013-12-08 LAB — COMPLETE METABOLIC PANEL WITH GFR
ALBUMIN: 4.7 g/dL (ref 3.5–5.2)
ALT: 41 U/L (ref 0–53)
AST: 20 U/L (ref 0–37)
Alkaline Phosphatase: 50 U/L (ref 39–117)
BUN: 17 mg/dL (ref 6–23)
CALCIUM: 9.9 mg/dL (ref 8.4–10.5)
CHLORIDE: 102 meq/L (ref 96–112)
CO2: 31 meq/L (ref 19–32)
CREATININE: 1.06 mg/dL (ref 0.50–1.35)
GFR, EST AFRICAN AMERICAN: 85 mL/min
GFR, Est Non African American: 74 mL/min
Glucose, Bld: 100 mg/dL — ABNORMAL HIGH (ref 70–99)
POTASSIUM: 4.8 meq/L (ref 3.5–5.3)
Sodium: 139 mEq/L (ref 135–145)
Total Bilirubin: 0.4 mg/dL (ref 0.2–1.2)
Total Protein: 7.2 g/dL (ref 6.0–8.3)

## 2013-12-08 LAB — POCT GLYCOSYLATED HEMOGLOBIN (HGB A1C): HEMOGLOBIN A1C: 5.9

## 2013-12-08 NOTE — Progress Notes (Signed)
   Subjective:    Patient ID: Stanley MATSUO, male    DOB: 14-Nov-1950, 63 y.o.   MRN: [id number]  HPI Stanley Estrada is here for his annual exam.  He states he is doing well.  No acute concerns or complaints today.  He does have some bilateral knee pain that interferes with him exercising how he wants to.  He is not a point where he wants to do anything about it yet.  Current Outpatient Prescriptions on File Prior to Visit  Medication Sig Dispense Refill  . beclomethasone (QVAR) 40 MCG/ACT inhaler Inhale 2 puffs into the lungs 2 (two) times daily.      Marland Kitchen albuterol (PROVENTIL HFA;VENTOLIN HFA) 108 (90 BASE) MCG/ACT inhaler Inhale 2 puffs into the lungs every 6 (six) hours as needed. For shortness of breath       No current facility-administered medications on file prior to visit.    I have reviewed and updated the following as appropriate: allergies, current medications, past family history, past medical history, past social history, past surgical history and problem list SHx: former smoker (<10 pack-year history)  Health Maintenance: up to date on colonoscopy, declined PSA   Review of Systems  Constitutional: Negative.   Respiratory: Negative.   Cardiovascular: Negative.   Gastrointestinal: Negative.   Genitourinary: Negative.   Neurological: Negative.        Objective:   Physical Exam BP 128/81  Pulse 80  Ht 5\' 8"  (1.727 m)  Wt 219 lb 14.4 oz (99.746 kg)  BMI 33.44 kg/m2 Gen: alert, cooperative, NAD HEENT: AT/[place], sclera white, MMM Neck: supple, no LAD, no bruits, thyroid normal CV: RRR, no murmurs Pulm: CTAB, no wheezes or rales Abd: +BS, soft, NTND Ext: no edema, 2+ DP pulses bilaterally Neuro: alert, oriented, 5/5 strength in all extremities      Assessment & Plan:

## 2013-12-08 NOTE — Assessment & Plan Note (Signed)
Form completed for insurance. Lipids checked last year.  ASCVD score is 14.4%.  He will need to f/u with his PCP to discuss statin therapy. Up to date on colonoscopy.  Declined PSA. Discussed daily aspirin 81mg .  Check cmp and a1c today. F/u in 1 year.

## 2013-12-08 NOTE — Assessment & Plan Note (Signed)
Currently off all BP meds and blood pressure is normal. Continue to monitor.

## 2013-12-08 NOTE — Patient Instructions (Signed)
It was nice to meet you!  Things to do to Keep yourself Healthy  - Exercise at least 30-45 minutes a day,  3-4 days a week.  - Eat a low-fat diet with lots of fruits and vegetables, up to 7-9 servings per day. - Seatbelts can save your life. Wear them always. - Smoke detectors on every level of your home, check batteries every year. - Eye Doctor - have an eye exam every 1-2 years - Safe sex - if you may be exposed to STDs, use a condom. - Alcohol If you drink, do it moderately,less than 2 drinks per dayt. - [place].  Choose someone to speak for you if you are not able. - Depression is common in our stressful world.If you're feeling down or losing interest in things you normally enjoy, please come in for a visit.  We are checking some blood work today.  I will call you if anything is wrong, otherwise you will get a letter with the results in the mail in 1-2 weeks.  Follow up in 1 year or sooner as needed.

## 2013-12-08 NOTE — Assessment & Plan Note (Signed)
Not too bothersome at this point unless he tries to exercise. Encouraged him to f/u with PCP to discuss medication vs injection.

## 2013-12-08 NOTE — Assessment & Plan Note (Signed)
Well controlled. Continue QVAR and albuterol prn.

## 2013-12-09 ENCOUNTER — Encounter: Payer: Self-pay | Admitting: Emergency Medicine

## 2014-10-20 ENCOUNTER — Encounter: Payer: Self-pay | Admitting: Gastroenterology

## 2015-02-19 ENCOUNTER — Encounter: Payer: Self-pay | Admitting: Gastroenterology

## 2016-11-22 ENCOUNTER — Other Ambulatory Visit: Payer: Self-pay | Admitting: Allergy and Immunology

## 2016-11-22 MED ORDER — ALBUTEROL SULFATE HFA 108 (90 BASE) MCG/ACT IN AERS: 2.0000 | INHALATION_SPRAY | Freq: Four times a day (QID) | RESPIRATORY_TRACT | 0 refills | Status: DC | PRN

## 2016-11-22 NOTE — Telephone Encounter (Signed)
Patient last saw Dr. Ishmael Holter on 02-04-15. He called today and made an appointment for Monday, 11-27-16. He would like to know if his Ventolin can be called into his pharmacy before his appointment. [place].

## 2016-11-22 NOTE — Telephone Encounter (Signed)
Sent script into pharmacy. 

## 2016-11-29 ENCOUNTER — Ambulatory Visit: Payer: Self-pay | Admitting: Allergy

## 2016-12-14 ENCOUNTER — Encounter (INDEPENDENT_AMBULATORY_CARE_PROVIDER_SITE_OTHER): Payer: Self-pay

## 2016-12-14 ENCOUNTER — Encounter: Payer: Self-pay | Admitting: Allergy

## 2016-12-14 ENCOUNTER — Ambulatory Visit (INDEPENDENT_AMBULATORY_CARE_PROVIDER_SITE_OTHER): Payer: Medicare Other | Admitting: Allergy

## 2016-12-14 VITALS — BP 132/76 | HR 82 | Temp 98.0°F | Resp 16 | Ht 65.75 in | Wt 221.2 lb

## 2016-12-14 DIAGNOSIS — J4530 Mild persistent asthma, uncomplicated: Secondary | ICD-10-CM

## 2016-12-14 DIAGNOSIS — J339 Nasal polyp, unspecified: Secondary | ICD-10-CM

## 2016-12-14 MED ORDER — ALBUTEROL SULFATE HFA 108 (90 BASE) MCG/ACT IN AERS: 2.0000 | INHALATION_SPRAY | Freq: Four times a day (QID) | RESPIRATORY_TRACT | 1 refills | Status: DC | PRN

## 2016-12-14 MED ORDER — BUDESONIDE 180 MCG/ACT IN AEPB: 2.0000 | INHALATION_SPRAY | Freq: Two times a day (BID) | RESPIRATORY_TRACT | 5 refills | Status: DC

## 2016-12-14 NOTE — Progress Notes (Signed)
Follow-up Note  RE: FRANCISCO MINTO MRN: [id number] DOB: 1951/06/27 Date of Office Visit: 12/14/2016   History of present illness: Stanley Estrada is a 66 y.o. male presenting today for follow-up of asthma.  He was last seen in office by Dr. Ishmael Holter on 02/07/2015.  He reports he was well up until about 3 weeks ago when he developed a viral illness. By this point he had run out of his Pulmicort controller medicine which she has been out for about the past 2-3 months. He used up all of his insulin during illnesses see did not have his Pulmicort. He has recovered from his viral illness but has noticed a difference in his breathing. He denies any nighttime awakenings. He has not required any ED or urgent care visits over the past 2 years for his asthma or any hospitalizations. This is not requiring oral steroids. He had tried Qvar in the past with Dr. Ishmael Holter but felt that the Pulmicort was more effective at controlling his asthma symptoms He has a history of nasal polyps and sees Dr. Ernesto Rutherford ENT. He had a polypectomy in 2011. He uses generic Flonase daily to help manage his polyps. He has a poor sense of smell in his left side nostril.  He sees ENT once every 6 months or so.  He denies any problems with aspirin use in the past.     Review of systems: ROS  All other systems negative unless noted above in HPI  Past medical/social/surgical/family history have been reviewed and are unchanged unless specifically indicated below.  No changes  Medication List: Allergies as of 12/14/2016   No Known Allergies     Medication List       Accurate as of 12/14/16  6:19 PM. Always use your most recent med list.          albuterol 108 (90 Base) MCG/ACT inhaler Commonly known as:  PROVENTIL HFA;VENTOLIN HFA Inhale 2 puffs into the lungs every 6 (six) hours as needed. For shortness of breath   budesonide 180 MCG/ACT inhaler Commonly known as:  PULMICORT Inhale 2 puffs into the lungs 2 (two)  times daily.   fluticasone 50 MCG/ACT nasal spray Commonly known as:  FLONASE Place 2 sprays into both nostrils daily.       Known medication allergies: No Known Allergies   Physical examination: Blood pressure 132/76, pulse 82, temperature 98 F (36.7 C), temperature source Oral, resp. rate 16, height 5' 5.75" (1.67 m), weight 221 lb 3.2 oz (100.3 kg), SpO2 95 %.  General: Alert, interactive, in no acute distress. HEENT: TMs pearly gray, turbinates mildly edematous without discharge, post-pharynx non erythematous. No evidence of polyps seen on exam. Neck: Supple without lymphadenopathy. Lungs: Clear to auscultation without wheezing, rhonchi or rales. {no increased work of breathing. CV: Normal S1, S2 without murmurs. Abdomen: Nondistended, nontender. Skin: Warm and dry, without lesions or rashes. Extremities:  No clubbing, cyanosis or edema. Neuro:   Grossly intact.  Diagnositics/Labs:  Spirometry: FEV1: 2.55L 95%, FVC: 3.4L  100%, ratio consistent with Nonobstructive pattern  Assessment and plan:   Asthma, Mild persistent     - will provide with sample Qvar 54mcg redihaler use 2 puffs daily use until sample is gone       - will refill your prescription for Pulmicort 161mcg 2 puffs once a day.   Increase to 2 puffs twice a day during illnesses/asthma flares     - use Ventolin 2 puffs every 4-6  hours as needed for cough, wheeze, difficulty breathing, chest tightness.  Monitor frequency of use.       Nasal Polyps     - continue OTC Flonase 1-2 sprays each nostril daily   Follow-up 6-9 months or sooner if needed  I appreciate the opportunity to take part in Kaedan's care. Please do not hesitate to contact me with questions.  Sincerely,   Prudy Feeler, MD Allergy/Immunology [facility]

## 2016-12-14 NOTE — Patient Instructions (Addendum)
Asthma     - will provide with sample Qvar inhaler use 2 puffs daily.        - will refill your prescription for Pulmicort 184mcg 2 puffs once a day.   Increase to 2 puffs twice a day during illnesses/asthma flares     - use Ventolin 2 puffs every 4-6 hours as needed for cough, wheeze, difficulty breathing, chest tightness.  Monitor frequency of use.       Nasal Polyps     - continue OTC Flonase 1-2 sprays each nostril daily   Follow-up 6-9 months or sooner if needed

## 2017-10-12 ENCOUNTER — Telehealth: Payer: Self-pay | Admitting: Allergy

## 2017-10-12 DIAGNOSIS — J4530 Mild persistent asthma, uncomplicated: Secondary | ICD-10-CM

## 2017-10-12 MED ORDER — ALBUTEROL SULFATE HFA 108 (90 BASE) MCG/ACT IN AERS: 2.0000 | INHALATION_SPRAY | Freq: Four times a day (QID) | RESPIRATORY_TRACT | 0 refills | Status: DC | PRN

## 2017-10-12 NOTE — Telephone Encounter (Signed)
30 day supply of albuterol sent to [facility]. Pt made aware.

## 2017-10-12 NOTE — Telephone Encounter (Signed)
Pt called and made appointment on Jan. 16,2018. But needs to have ventolin hfa called into [facility]. Call him when you call it in [phone].

## 2017-10-19 ENCOUNTER — Encounter: Payer: Self-pay | Admitting: Allergy

## 2017-10-19 ENCOUNTER — Ambulatory Visit (INDEPENDENT_AMBULATORY_CARE_PROVIDER_SITE_OTHER): Payer: Medicare Other | Admitting: Allergy

## 2017-10-19 VITALS — BP 130/78 | HR 82 | Resp 18

## 2017-10-19 DIAGNOSIS — J4530 Mild persistent asthma, uncomplicated: Secondary | ICD-10-CM

## 2017-10-19 DIAGNOSIS — J339 Nasal polyp, unspecified: Secondary | ICD-10-CM | POA: Diagnosis not present

## 2017-10-19 MED ORDER — BUDESONIDE-FORMOTEROL FUMARATE 160-4.5 MCG/ACT IN AERO: 2.0000 | INHALATION_SPRAY | Freq: Two times a day (BID) | RESPIRATORY_TRACT | 5 refills | Status: DC

## 2017-10-19 NOTE — Progress Notes (Signed)
Follow-up Note  RE: CAMERAN PETTEY MRN: [id number] DOB: 22-Jan-1951 Date of Office Visit: 10/19/2017   History of present illness: Stanley Estrada is a 67 y.o. male presenting today for follow-up of asthma.  He was last seen in the office on 12/14/16 by myself.  He reports he was doing well until about a month or so ago when he started having more asthma symptoms.  He reports symptoms of coughing, wheezing and chest tightness.  He is needing to use his albuterol multiple times a day with relief of symptoms.  He does report nighttime awakenings during this time as well.  He states he ran out of his Qvar that was provided as a sample after his last visit.  He states he was able to make it last about 6 months as he reports he was not using it daily but more so as he felt he needed it.  He states when he would use the Qvar he felt improvements in his symptoms and would have like to stay on this medication however he reports it is too expensive as his insurance does not cover it.  The he states this time a year he normally does have a flare of his asthma.  He denies any ED or urgent care visits and has not been on any oral steroids since his last visit.  He does report he has gained weight since his last visit which she would like to lose.  However he states that both of his knees are "bad "and do not allow him to perform any physical activity.  He continues to use Flonase daily for nasal polyp management.  He has now had 2 nasal polyp removal surgeries with the last being in 2015.  He denies any complete anosmia and only reports some mild nasal stuffiness.  Review of systems: Review of Systems  Constitutional: Negative for chills, fever, malaise/fatigue and weight loss.  HENT: Positive for congestion. Negative for ear discharge, ear pain, nosebleeds, sinus pain, sore throat and tinnitus.   Eyes: Negative for pain, discharge and redness.  Respiratory: Positive for cough, shortness of breath and  wheezing. Negative for hemoptysis and sputum production.   Cardiovascular: Negative for chest pain.  Gastrointestinal: Negative for abdominal pain, constipation, diarrhea, heartburn, nausea and vomiting.  Musculoskeletal: Negative for joint pain and myalgias.  Skin: Negative for itching and rash.  Neurological: Negative for headaches.    All other systems negative unless noted above in HPI  Past medical/social/surgical/family history have been reviewed and are unchanged unless specifically indicated below.  No changes  Medication List: Allergies as of 10/19/2017   No Known Allergies     Medication List        Accurate as of 10/19/17  1:08 PM. Always use your most recent med list.          albuterol 108 (90 Base) MCG/ACT inhaler Commonly known as:  PROVENTIL HFA;VENTOLIN HFA Inhale 2 puffs into the lungs every 6 (six) hours as needed. For shortness of breath   budesonide 180 MCG/ACT inhaler Commonly known as:  PULMICORT Inhale 2 puffs into the lungs 2 (two) times daily.   fluticasone 50 MCG/ACT nasal spray Commonly known as:  FLONASE Place 2 sprays into both nostrils daily.       Known medication allergies: No Known Allergies   Physical examination: Blood pressure 130/78, pulse 82, resp. rate 18, SpO2 94 %.  General: Alert, interactive, in no acute distress. HEENT: PERRLA, TMs pearly  gray, turbinates mildly edematous without discharge with visible polyp above inf turbinate of lt nares, post-pharynx non erythematous. Neck: Supple without lymphadenopathy. Lungs: Mildly decreased breath sounds with expiratory wheezing bilaterally. {no increased work of breathing. Pt used personal albuterol approx 3 hr prior to visit.  CV: Normal S1, S2 without murmurs. Abdomen: Nondistended, nontender. Skin: Warm and dry, without lesions or rashes. Extremities:  No clubbing, cyanosis or edema. Neuro:   Grossly intact.  Diagnositics/Labs:  Spirometry: FEV1: 2.35L  82%, FVC: 4.31L   111%, ratio consistent with nonobstructive pattern  Pt used personal albuterol approx 3 hr prior to visit.   Assessment and plan:   Asthma, mild persistent     - at this time appear to have acute flare up of symptoms off any maintenance medications     - will provide with sample Symbicort 152mcg inhaler use 2 puffs twice a day at this time.  Prescription has also been sent.        - use Ventolin 2 puffs every 4-6 hours as needed for cough, wheeze, difficulty breathing, chest tightness.  Monitor frequency of use.   Asthma control goals:   Full participation in all desired activities (may need albuterol before activity)  Albuterol use two time or less a week on average (not counting use with activity)  Cough interfering with sleep two time or less a month  Oral steroids no more than once a year  No hospitalizations       Nasal Polyps     - continue OTC Flonase 1-2 sprays each nostril daily   Follow-up 6-9 months or sooner if needed   No Follow-up on file.  I appreciate the opportunity to take part in Arsenio's care. Please do not hesitate to contact me with questions.  Sincerely,   Prudy Feeler, MD Allergy/Immunology [facility] [place]

## 2017-10-19 NOTE — Patient Instructions (Signed)
Asthma     - at this time appear to have acute flare up of symptoms     - will provide with sample Symbicort 162mcg inhaler use 2 puffs twice a day at this time.        - will send in a prescription for Symbicort and if it not covered will discuss with pharmacy options that are covered.       - use Ventolin 2 puffs every 4-6 hours as needed for cough, wheeze, difficulty breathing, chest tightness.  Monitor frequency of use.       Nasal Polyps     - continue OTC Flonase 1-2 sprays each nostril daily   Follow-up 6-9 months or sooner if needed

## 2017-10-31 ENCOUNTER — Ambulatory Visit: Payer: Medicare Other | Admitting: Allergy

## 2018-05-07 ENCOUNTER — Telehealth: Payer: Self-pay | Admitting: Allergy

## 2018-05-07 DIAGNOSIS — J4530 Mild persistent asthma, uncomplicated: Secondary | ICD-10-CM

## 2018-05-07 MED ORDER — ALBUTEROL SULFATE HFA 108 (90 BASE) MCG/ACT IN AERS: 2.0000 | INHALATION_SPRAY | Freq: Four times a day (QID) | RESPIRATORY_TRACT | 0 refills | Status: DC | PRN

## 2018-05-07 NOTE — Telephone Encounter (Signed)
Refill sent in left message for patient advising of this.

## 2018-05-07 NOTE — Telephone Encounter (Signed)
Pt came in and made appointment for 05/30/2018 with dr Nelva Bush and need to have Ventolin called into [facility]. [phone].

## 2018-05-30 ENCOUNTER — Encounter: Payer: Self-pay | Admitting: Allergy

## 2018-05-30 ENCOUNTER — Ambulatory Visit (INDEPENDENT_AMBULATORY_CARE_PROVIDER_SITE_OTHER): Payer: Medicare Other | Admitting: Allergy

## 2018-05-30 VITALS — BP 142/90 | HR 96 | Resp 20

## 2018-05-30 DIAGNOSIS — J339 Nasal polyp, unspecified: Secondary | ICD-10-CM

## 2018-05-30 DIAGNOSIS — J4530 Mild persistent asthma, uncomplicated: Secondary | ICD-10-CM

## 2018-05-30 DIAGNOSIS — J4540 Moderate persistent asthma, uncomplicated: Secondary | ICD-10-CM

## 2018-05-30 MED ORDER — ALBUTEROL SULFATE HFA 108 (90 BASE) MCG/ACT IN AERS: 2.0000 | INHALATION_SPRAY | Freq: Four times a day (QID) | RESPIRATORY_TRACT | 1 refills | Status: DC | PRN

## 2018-05-30 MED ORDER — FLUTICASONE-SALMETEROL 100-50 MCG/DOSE IN AEPB: 1.0000 | INHALATION_SPRAY | Freq: Two times a day (BID) | RESPIRATORY_TRACT | 5 refills | Status: DC

## 2018-05-30 NOTE — Patient Instructions (Addendum)
Asthma     -increased symptoms off control inhaler.   Will send-in prescription of Wixela (generic advair) as does appear to be a Tier 5 drug covered under your plan.       - will provide with sample Symbicort 5mcg inhaler use 1 inhalation twice a day to use until you are able to get Wixela.  Wixela is 1 inhalation twice a day as well.         - use albuterol 2 puffs every 4-6 hours as needed for cough, wheeze, difficulty breathing, chest tightness.  Monitor frequency of use.    Asthma control goals:   Full participation in all desired activities (may need albuterol before activity)  Albuterol use two time or less a week on average (not counting use with activity)  Cough interfering with sleep two time or less a month  Oral steroids no more than once a year  No hospitalizations      Nasal Polyps     - continue OTC Flonase 2 sprays each nostril daily     - we did discuss option of Dupixent which is an injectable medication that is approved for management of eczema, eosinophilic asthma as well as now chronic rhinosinusitis with nasal polyps.  Brochure provided today.     Follow-up 6 months or sooner if needed

## 2018-05-30 NOTE — Progress Notes (Signed)
Follow-up Note  RE: Stanley Estrada MRN: [id number] DOB: [birth year] Date of Office Visit: 05/30/2018   History of present illness: Stanley Estrada is a 67 y.o. male presenting today for follow-up of asthma.  He was last seen in the office on 10/19/17 by myself.  Since this visit he denies any major health changes, surgeries or hospitalizations.  He states he did run out of his inhalers (symbicort) about a month ago.  We did provide him with samples of symbicort at last visit.  He states with 2 puffs at a time of symbicort he developed headaches thus he decreased to 1 puff which he tolerated.  When he saw he was running low on the symbicort sample he started to stretch out his doses.  He states his inhalers are too high to afford and he has a high deductible plan.  Thus when he ran out of his symbicort completely he called office for another sample.  When he does not have any control medication in place he does have significant wheezing mostly at night.  He has needed to use more of his albuterol which he is also running low on since he has been out of symbicort.  He denies any ED/UC visits or oral steroids since last visit.     He continues to use flonase daily to help control nasal polyps.  He has seen ENT, Dr. Ernesto Rutherford for his nasal polyps and has had polyp removal in the past with regrowth.   Review of systems: Review of Systems  Constitutional: Negative for chills, fever and malaise/fatigue.  HENT: Negative for congestion, ear discharge, ear pain, nosebleeds and sore throat.   Eyes: Negative for pain, discharge and redness.  Respiratory: Positive for wheezing. Negative for cough, sputum production and shortness of breath.   Cardiovascular: Negative for chest pain.  Gastrointestinal: Negative for abdominal pain, constipation, diarrhea, nausea and vomiting.  Musculoskeletal: Negative for joint pain.  Skin: Negative for itching and rash.  Neurological: Negative for headaches.    All  other systems negative unless noted above in HPI  Past medical/social/surgical/family history have been reviewed and are unchanged unless specifically indicated below.  No changes  Medication List: Allergies as of 05/30/2018   No Known Allergies     Medication List        Accurate as of 05/30/18 12:21 PM. Always use your most recent med list.          albuterol 108 (90 Base) MCG/ACT inhaler Commonly known as:  PROVENTIL HFA;VENTOLIN HFA Inhale 2 puffs into the lungs every 6 (six) hours as needed. For shortness of breath   budesonide-formoterol 160-4.5 MCG/ACT inhaler Commonly known as:  SYMBICORT Inhale 2 puffs into the lungs 2 (two) times daily.   fluticasone 50 MCG/ACT nasal spray Commonly known as:  FLONASE Place 2 sprays into both nostrils daily.       Known medication allergies: No Known Allergies   Physical examination: Blood pressure (!) 142/90, pulse 96, resp. rate 20.  General: Alert, interactive, in no acute distress. HEENT: PERRLA, TMs pearly gray, turbinates mildly edematous without discharge unable to visualize polyps, post-pharynx non erythematous. Neck: Supple without lymphadenopathy. Lungs: good aeration throughout with mild audibel wheezing in all lung fields, rhonchi or rales. {no increased work of breathing. CV: Normal S1, S2 without murmurs. Abdomen: Nondistended, nontender. Skin: Warm and dry, without lesions or rashes. Extremities:  No clubbing, cyanosis or edema. Neuro:   Grossly intact.  Diagnositics/Labs: Spirometry: FEV1: 1.79L  63%, FVC: 3.01L 78%, ratio consistent with mixed obstructive/restrictive pattern.  off ICS/LABA  Assessment and plan:   Asthma, mod persistent     -increased symptoms off control inhaler.   Will send-in prescription of Wixela (generic advair) as does appear to be a Tier 5 drug covered under your plan.       - will provide with sample Symbicort 58mcg inhaler use 1 inhalation twice a day to use until you are able  to get Wixela.  Wixela is 1 inhalation twice a day as well.         - use albuterol 2 puffs every 4-6 hours as needed for cough, wheeze, difficulty breathing, chest tightness.  Monitor frequency of use.    Asthma control goals:   Full participation in all desired activities (may need albuterol before activity)  Albuterol use two time or less a week on average (not counting use with activity)  Cough interfering with sleep two time or less a month  Oral steroids no more than once a year  No hospitalizations      Nasal Polyps     - continue OTC Flonase 2 sprays each nostril daily     - we did discuss option of Dupixent which is an injectable medication that is approved for management of eczema, eosinophilic asthma as well as now chronic rhinosinusitis with nasal polyps.  Brochure provided today.     Follow-up 6 months or sooner if needed  I appreciate the opportunity to take part in Stanley Estrada's care. Please do not hesitate to contact me with questions.  Sincerely,   Prudy Feeler, MD Allergy/Immunology [facility] [place]

## 2019-12-22 ENCOUNTER — Other Ambulatory Visit: Payer: Self-pay

## 2019-12-22 MED ORDER — FLUTICASONE-SALMETEROL 100-50 MCG/DOSE IN AEPB: 1.0000 | INHALATION_SPRAY | Freq: Two times a day (BID) | RESPIRATORY_TRACT | 0 refills | Status: DC

## 2019-12-22 MED ORDER — ALBUTEROL SULFATE HFA 108 (90 BASE) MCG/ACT IN AERS: 2.0000 | INHALATION_SPRAY | Freq: Four times a day (QID) | RESPIRATORY_TRACT | 0 refills | Status: DC | PRN

## 2019-12-22 NOTE — Telephone Encounter (Signed)
Patient called requesting a refill. I asked if he called the pharmacy and he stated he did not have any refills and needs an appt with Dr Nelva Bush.   I offered the patient a sooner appt on Friday with Webb Silversmith, but he wanted to see Dr Nelva Bush. Patient is scheduled for 01-08-20  I also told the patient he has not been seen in over a year & I will have to send a message to the nurse.   Generic Albuterol & Generic Advair  [facility]

## 2019-12-22 NOTE — Telephone Encounter (Signed)
Patient informed that courtesy refills for generic albuterol and Wixela were sent to the requested pharmacy.

## 2020-01-08 ENCOUNTER — Other Ambulatory Visit: Payer: Self-pay

## 2020-01-08 ENCOUNTER — Encounter: Payer: Self-pay | Admitting: Allergy

## 2020-01-08 ENCOUNTER — Ambulatory Visit: Payer: Medicare Other | Admitting: Allergy

## 2020-01-08 VITALS — BP 140/88 | HR 90 | Temp 98.2°F | Resp 18 | Ht 65.0 in | Wt 227.8 lb

## 2020-01-08 DIAGNOSIS — J339 Nasal polyp, unspecified: Secondary | ICD-10-CM

## 2020-01-08 DIAGNOSIS — J4540 Moderate persistent asthma, uncomplicated: Secondary | ICD-10-CM | POA: Diagnosis not present

## 2020-01-08 NOTE — Patient Instructions (Addendum)
Asthma     - will look into your insurance plan to determine what is the best covered inhaler regimen     - provided today with month sample of Trelegy 1 puff once a day.  This is a triple therapy medication which would be a step-up from [place].  This may have better coverage than what you have currently     - do not use Wixela while using Trelegy      - use albuterol 2 puffs every 4-6 hours as needed for cough, wheeze, difficulty breathing, chest tightness.  Monitor frequency of use.    Asthma control goals:   Full participation in all desired activities (may need albuterol before activity)  Albuterol use two time or less a week on average (not counting use with activity)  Cough interfering with sleep two time or less a month  Oral steroids no more than once a year  No hospitalizations      Nasal Polyps     - continue OTC Flonase 2 sprays each nostril daily     - we did discuss option of Dupixent which is an injectable medication that is approved for management of eczema, eosinophilic asthma as well as now chronic rhinosinusitis with nasal polyps.     Follow-up 6 months or sooner if needed

## 2020-01-08 NOTE — Progress Notes (Signed)
Follow-up Note  RE: Stanley Estrada MRN: [id number] DOB: November 19, 1950 Date of Office Visit: 01/08/2020   History of present illness: Stanley Estrada is a 69 y.o. male presenting today for follow-up of asthma and nasal polpyposis.  He was last seen in the office on 05/30/18 by myself.  He has been overall well since last visit.  At this time he states he did change his insurance plan to Medicare advantage as he reports it had no premium.  However now when he went to get his inhaler medications they were more expensive than previous plan.  He states the generic albuterol inhaler was about 50$ and his Wixela was $160.  He states these are expensive for him. He did get these filled however but ongoing would like to know if there were other alternatives that is better covered.   He does state due to this he will use his Wixela maybe every other day.  He does report he may then need his albuterol the other days.  He also notes if he has been active like doing yardwork he likely will need to use his albuterol.   He has not had any ED/UC visits or any systemic steroid needs.   He continues to use Flonase for polyp control.  We have discussed dupixent as an add-on therapy in the past however he states he is uncomfortable moving forward with dupixent due to the side effect profile he has read.    Review of systems: Review of Systems  Constitutional: Negative.   HENT: Negative.   Eyes: Negative.   Respiratory: Positive for cough and shortness of breath.   Cardiovascular: Negative.   Gastrointestinal: Negative.   Musculoskeletal: Negative.   Skin: Negative.   Neurological: Negative.     All other systems negative unless noted above in HPI  Past medical/social/surgical/family history have been reviewed and are unchanged unless specifically indicated below.  No changes  Medication List: Current Outpatient Medications  Medication Sig Dispense Refill  . albuterol (VENTOLIN HFA) 108 (90 Base)  MCG/ACT inhaler Inhale 2 puffs into the lungs every 6 (six) hours as needed. For shortness of breath 18 g 0  . fluticasone (FLONASE) 50 MCG/ACT nasal spray Place 2 sprays into both nostrils daily.    . Fluticasone-Salmeterol (WIXELA INHUB) 100-50 MCG/DOSE AEPB Inhale 1 puff into the lungs 2 (two) times daily. 60 each 0  . budesonide-formoterol (SYMBICORT) 160-4.5 MCG/ACT inhaler Inhale 2 puffs into the lungs 2 (two) times daily. (Patient not taking: Reported on 01/08/2020) 1 Inhaler 5   No current facility-administered medications for this visit.     Known medication allergies: No Known Allergies   Physical examination: Blood pressure 140/88, pulse 90, temperature 98.2 F (36.8 C), temperature source Temporal, resp. rate 18, height 5\' 5"  (1.651 m), weight 227 lb 12.8 oz (103.3 kg), SpO2 94 %.  General: Alert, interactive, in no acute distress. HEENT: PERRLA, TMs pearly gray, turbinates minimally edematous without discharge, post-pharynx non erythematous. Neck: Supple without lymphadenopathy. Lungs: Clear to auscultation without wheezing, rhonchi or rales. {no increased work of breathing. CV: Normal S1, S2 without murmurs. Abdomen: Nondistended, nontender. Skin: Warm and dry, without lesions or rashes. Extremities:  No clubbing, cyanosis or edema. Neuro:   Grossly intact.  Diagnositics/Labs:  Spirometry: FEV1: 3.41L 129%, FVC: 3.87L 107%, ratio consistent with nonobstructive pattern  Assessment and plan:   Asthma, mod persistent     - will look into your insurance plan to determine what is the  best covered inhaler regimen     - provided today with month sample of Trelegy 1 puff once a day.  This is a triple therapy medication which would be a step-up from [place].  This may have better coverage than what you have currently     - do not use Wixela while using Trelegy      - use albuterol 2 puffs every 4-6 hours as needed for cough, wheeze, difficulty breathing, chest tightness.   Monitor frequency of use.    Asthma control goals:   Full participation in all desired activities (may need albuterol before activity)  Albuterol use two time or less a week on average (not counting use with activity)  Cough interfering with sleep two time or less a month  Oral steroids no more than once a year  No hospitalizations      Nasal Polyposis     - continue OTC Flonase 2 sprays each nostril daily     - we did discuss option of Dupixent which is an injectable medication that is approved for management of eczema, eosinophilic asthma as well as now chronic rhinosinusitis with nasal polyps.    Follow-up 6 months or sooner if needed  I appreciate the opportunity to take part in [name]'s care. Please do not hesitate to contact me with questions.  Sincerely,   Prudy Feeler, MD Allergy/Immunology [facility] [place]

## 2020-01-14 ENCOUNTER — Telehealth: Payer: Self-pay | Admitting: *Deleted

## 2020-01-14 NOTE — Telephone Encounter (Signed)
-----   Message from [facility], MD sent at 01/13/2020 11:32 AM EDT ----- Regarding: RE: inhalers covered by insurance -This is so unfortunate because the [place] is what he was on previously and the inhaler that he states he is no longer very affordable for him and it is in the lowest tier. I guess what we could try to do to see if we can get free drug is to see if he may qualify for AZ&me program.  That way he could potentially get Symbicort at no cost to him if he qualifies for this program.  This is the only drug program that I know of where he potentially could get free drug.  I gave him Trelegy samples at his visit but this looks like it will be even more than the [place]. ----- Message ----- From: Derl Barrow, CMA Sent: 01/13/2020  10:25 AM EDT To: Kennith Gain, MD Subject: RE: inhalers covered by insurance              Ok please bare with me as this is a lengthy message. I looked up the patient's formulary and even though it does not tell me how much the medications cost, it does tell me the tiers as to which these medications are listed as with his insurance preference. I was thinking I can send you the list of these inhalers and you can maybe tell you ones you prefer most, then I can call his pharmacy and get more of a dollar amount for the cost for the patient.  So Tier 2 means that they prefer Generic-which is the 2nd to lowest cost sharing tier.  Tier 3 is Preferred as brand name and contain mostly brand name drugs and high cost generics.   Flovent Diskus- 3 Flovent HFA-3 Pulmicort Flexhaler-3 QVAR Redihaler-3 Atrovent HFA-3 Incruse Ellipta-3 [id number] Albuterol,ProAir,Ventolin-3 Anoro-3 Breo-3 Combivent Respimat-4 Dulera-3 Fluticason Propion-Salmeterol-2 Stiolto Respimat-3 Symbicort-3 Trelegy-3 Wixela [id number] (Prefers Generic)  ----- Message ----- From: Kennith Gain, MD Sent: 01/09/2020   4:11 PM EDT To: Jaquita Folds  Clinical Subject: inhalers covered by insurance                  Hello.  Can some see if there is a list of preferred inhaler medications for this pt's insurance plan.  Check card in epic (he states its medicare advantage).    He has been on wixela ($160) and generic albuterol ($50) and states both were more expensive for him with his insurance that before.    I gave him trelegy samples.  Let me know if we can identify any better covered inhalers for him.    Thanks.

## 2020-01-14 NOTE — Telephone Encounter (Signed)
Called and spoke with patient and decided that the AZ&Me program would be the best program to try and receive approval for the medication. Application and Checklist have been printed out and filled out as much as possible and signed by Dr. Nelva Bush and has been mailed to the patient's home. Asked patient to fill out his part and provide insurance card and proof of income and please bring back to the [place] office and I will be glad to fax it for him. Did discuss with Dr. Nelva Bush that if Stanley Estrada is not covered through AZ&Me we will switch the inhaler to Symbicort. Asked patient to call with any questions or for help. Patient verbalized understanding. Application has been mailed to patient's home.

## 2020-07-15 ENCOUNTER — Other Ambulatory Visit: Payer: Self-pay

## 2020-07-15 ENCOUNTER — Encounter: Payer: Self-pay | Admitting: Allergy

## 2020-07-15 ENCOUNTER — Ambulatory Visit (INDEPENDENT_AMBULATORY_CARE_PROVIDER_SITE_OTHER): Payer: Medicare Other | Admitting: Allergy

## 2020-07-15 VITALS — BP 126/70 | HR 86 | Temp 98.7°F | Wt 222.4 lb

## 2020-07-15 DIAGNOSIS — J339 Nasal polyp, unspecified: Secondary | ICD-10-CM

## 2020-07-15 DIAGNOSIS — J4540 Moderate persistent asthma, uncomplicated: Secondary | ICD-10-CM

## 2020-07-15 NOTE — Patient Instructions (Addendum)
Asthma     - provided with sample of Breztri take 2 puffs twice a day or Trelegy 1 puff once a day.    Unsure if chest pain symptoms were related toTrelegy use or not however it is reassuring that you have not noted this issue with resuming Trelegy.  However would try Breztri at this time     - use albuterol 2 puffs every 4-6 hours as needed for cough, wheeze, difficulty breathing, chest tightness.  Monitor frequency of use.    Asthma control goals:   Full participation in all desired activities (may need albuterol before activity)  Albuterol use two time or less a week on average (not counting use with activity)  Cough interfering with sleep two time or less a month  Oral steroids no more than once a year  No hospitalizations      Nasal Polyps     - continue Flonase 2 sprays each nostril daily     - we have discussed option of Dupixent which is an injectable medication that is approved for management of eczema, eosinophilic asthma as well as now chronic rhinosinusitis with nasal polyps.     Follow-up 6 months or sooner if needed

## 2020-07-15 NOTE — Progress Notes (Signed)
Follow-up Note  RE: Stanley Estrada MRN: [id number] DOB: [birth year] Date of Office Visit: 07/15/2020   History of present illness: Stanley Estrada is a 69 y.o. male presenting today for follow-up of asthma and nasal polyposis.  He was last seen in the office on 320/21 by myself.  He states he was having a soreness in his chest.  He saw an ad on tv that said that chest pain was a side effect of Trelegy thus he stopped using.  He states he was only taking the trelegy sample for couple weeks before seeing the ad.  He states the chest soreness would come and go and he points to his right upper chest area.  He states he is not sure if the chest soreness is related to Trelegy use or to something else.  But because he was concerned about it he stopped the medication.  However he restarted taking trelegy the past several days as he has been having more shortness of breath and has not had further chest pain recur at this time.  He is needing to use his albuterol quite frequently at least twice a day if not more due to his shortness of breath as well as wheezing that seems to be worse at night.  He states the Trelegy does work and he notes an improvement in his symptoms and does not have to use the albuterol typically when he is on the Trelegy but again his concern is the chest pain symptoms he had earlier this year. He again states that his insurance plan does not cover for any inhaler medications.  He states all of the inhaler medications including albuterol are still quite expensive for him to pick up. He continues to use Flonase which she states his insurance does cover ironically being an over-the-counter medication for control of his nasal polyps.  He denies any worsening congestion or a lack of smell or taste at this time.  He is still not interested in the option of Dupixent due to its potential side effects on its safety profile.    Review of systems: Review of Systems  Constitutional:  Negative.   HENT: Negative.   Eyes: Negative.   Respiratory: Positive for cough, shortness of breath and wheezing.   Cardiovascular: Negative.   Gastrointestinal: Negative.   Musculoskeletal: Negative.   Skin: Negative.   Neurological: Negative.     All other systems negative unless noted above in HPI  Past medical/social/surgical/family history have been reviewed and are unchanged unless specifically indicated below.  No changes  Medication List: Current Outpatient Medications  Medication Sig Dispense Refill  . albuterol (VENTOLIN HFA) 108 (90 Base) MCG/ACT inhaler Inhale 2 puffs into the lungs every 6 (six) hours as needed. For shortness of breath 18 g 0  . fluticasone (FLONASE) 50 MCG/ACT nasal spray Place 2 sprays into both nostrils daily.    . budesonide-formoterol (SYMBICORT) 160-4.5 MCG/ACT inhaler Inhale 2 puffs into the lungs 2 (two) times daily. (Patient not taking: Reported on 01/08/2020) 1 Inhaler 5  . Fluticasone-Salmeterol (WIXELA INHUB) 100-50 MCG/DOSE AEPB Inhale 1 puff into the lungs 2 (two) times daily. 60 each 0   No current facility-administered medications for this visit.     Known medication allergies: No Known Allergies   Physical examination: Blood pressure 126/70, pulse 86, temperature 98.7 F (37.1 C), weight 222 lb 6.4 oz (100.9 kg), SpO2 93 %.  General: Alert, interactive, in no acute distress. HEENT: PERRLA, TMs pearly gray,  turbinates mildly edematous without discharge, post-pharynx non erythematous. Neck: Supple without lymphadenopathy. Lungs: Mildly decreased breath sounds bilaterally without wheezing, rhonchi or rales. {no increased work of breathing. CV: Normal S1, S2 without murmurs. Abdomen: Nondistended, nontender. Skin: Warm and dry, without lesions or rashes. Extremities:  No clubbing, cyanosis or edema. Neuro:   Grossly intact.  Diagnositics/Labs: None today  Assessment and plan:   Asthma, mod persistent     - provided with  sample of Breztri take 2 puffs twice a day or Trelegy 1 puff once a day.    Unsure if chest pain symptoms were related to Trelegy use or not however it is reassuring that you have not noted this issue with resuming Trelegy.  However would try Breztri at this time.       -Due to his insurance and inability to afford the inhaler medications that we would like for him to be on we will continue to provide samples when we can.     - use albuterol 2 puffs every 4-6 hours as needed for cough, wheeze, difficulty breathing, chest tightness.  Monitor frequency of use.    Asthma control goals:   Full participation in all desired activities (may need albuterol before activity)  Albuterol use two time or less a week on average (not counting use with activity)  Cough interfering with sleep two time or less a month  Oral steroids no more than once a year  No hospitalizations      Nasal Polyps     - continue Flonase 2 sprays each nostril daily     - we have discussed option of Dupixent which is an injectable medication that is approved for management of eczema, eosinophilic asthma as well as now chronic rhinosinusitis with nasal polyps.     Follow-up 6 months or sooner if needed  I appreciate the opportunity to take part in Stanley Estrada's care. Please do not hesitate to contact me with questions.  Sincerely,   Prudy Feeler, MD Allergy/Immunology [facility]

## 2020-07-18 MED ORDER — ALBUTEROL SULFATE HFA 108 (90 BASE) MCG/ACT IN AERS: 2.0000 | INHALATION_SPRAY | Freq: Four times a day (QID) | RESPIRATORY_TRACT | 0 refills | Status: DC | PRN

## 2021-01-13 ENCOUNTER — Other Ambulatory Visit: Payer: Self-pay

## 2021-01-13 ENCOUNTER — Encounter: Payer: Self-pay | Admitting: Allergy

## 2021-01-13 ENCOUNTER — Ambulatory Visit (INDEPENDENT_AMBULATORY_CARE_PROVIDER_SITE_OTHER): Payer: Medicare Other | Admitting: Family Medicine

## 2021-01-13 VITALS — BP 128/78 | HR 88 | Temp 98.0°F | Resp 17 | Ht 65.0 in | Wt 214.2 lb

## 2021-01-13 DIAGNOSIS — J339 Nasal polyp, unspecified: Secondary | ICD-10-CM | POA: Diagnosis not present

## 2021-01-13 DIAGNOSIS — J4540 Moderate persistent asthma, uncomplicated: Secondary | ICD-10-CM | POA: Diagnosis not present

## 2021-01-13 DIAGNOSIS — K219 Gastro-esophageal reflux disease without esophagitis: Secondary | ICD-10-CM | POA: Diagnosis not present

## 2021-01-13 MED ORDER — ALBUTEROL SULFATE HFA 108 (90 BASE) MCG/ACT IN AERS: 2.0000 | INHALATION_SPRAY | Freq: Four times a day (QID) | RESPIRATORY_TRACT | 0 refills | Status: DC | PRN

## 2021-01-13 NOTE — Patient Instructions (Addendum)
Asthma Begin Breztri 2 puffs twice a day with a spacer to prevent cough or wheeze. Let us know if you prefer Trellegy or Breztri Continue albuterol 2 puffs once every 4 hours as needed for cough or wheeze. Try to use albuterol only as needed  Nasal polyposis Continue Flonase 2 sprays in each nostril once a day Begin saline nasal rinses as needed for nasal symptoms. Use this before any medicated nasal sprays for best result Call the clinic if you are interested in beginning [place].   Reflux Dietary and lifestyle modifications as listed below  Call the clinic if this treatment plan is not working well for you  Follow up in 6 months or sooner if needed.   Lifestyle Changes for Controlling GERD When you have GERD, stomach acid feels as if it's backing up toward your mouth. Whether or not you take medication to control your GERD, your symptoms can often be improved with lifestyle changes.   Raise Your Head  Reflux is more likely to strike when you're lying down flat, because stomach fluid can  flow backward more easily. Raising the head of your bed 4-6 inches can help. To do this:  Slide blocks or books under the legs at the head of your bed. Or, place a wedge under  the mattress. Many foam stores can make a suitable wedge for you. The wedge  should run from your waist to the top of your head.  Don't just prop your head on several pillows. This increases pressure on your  stomach. It can make GERD worse.  Watch Your Eating Habits Certain foods may increase the acid in your stomach or relax the lower esophageal sphincter, making GERD more likely. It's best to avoid the following:  Coffee, tea, and carbonated drinks (with and without caffeine)  Fatty, fried, or spicy food  Mint, chocolate, onions, and tomatoes  Any other foods that seem to irritate your stomach or cause you pain  Relieve the Pressure  Eat smaller meals, even if you have to eat more often.  Don't lie  down right after you eat. Wait a few hours for your stomach to empty.  Avoid tight belts and tight-fitting clothes.  Lose excess weight.  Tobacco and Alcohol  Avoid smoking tobacco and drinking alcohol. They can make GERD symptoms worse.

## 2021-01-13 NOTE — Progress Notes (Signed)
[place] [address] Dept: [phone]  FOLLOW UP NOTE  Patient ID: Stanley Estrada, male    DOB: [birth year]  Age: 70 y.o. MRN: [id number] Date of Office Visit: 01/13/2021  Assessment  Chief Complaint: Asthma  HPI Stanley Estrada is a 70 year old male who presents to the clinic for a follow up visit. He was last seen in this clinic on 07/15/2020 by Dr. Nelva Bush for evaluation of asthma and nasal polyposis. At today's visit, he reports his asthma has been moderately well controlled with shortness of breath occurring with activity, wheezing occurring intermittently mostly at night, and no coughing.  He is currently using Trelegy Ellipta 200- 1 puff 3-4 times a week and using albuterol about every other day on a regular schedule.  He reports that he used the last puff of Trelegy today and has not tried in the [place] sample that was provided at his last office visit.  He does report heartburn occurring 1-2 times a week for which he takes Rolaids with relief of symptoms.  At this point, he is not willing to take a preventative medication for reflux.  Nasal polyposis is reported as poorly controlled with symptoms including nasal congestion which is worse on the left.  He reports that he is able to taste and smell.  He continues Flonase 2 sprays in each nostril once a day and is not currently using a nasal saline rinse.  He is not interested in beginning [place] at this time.  His current medications are listed in the chart.   Drug Allergies:  No Known Allergies  Physical Exam: BP 128/78 (BP Location: Right Arm, Patient Position: Sitting, Cuff Size: Normal)   Pulse 88   Temp 98 F (36.7 C) (Temporal)   Resp 17   Ht 5\' 5"  (1.651 m)   Wt 214 lb 3.2 oz (97.2 kg)   SpO2 95%   BMI 35.64 kg/m    Physical Exam Vitals reviewed.  Constitutional:      Appearance: Normal appearance.  HENT:     Head: Normocephalic and atraumatic.     Right Ear: Tympanic membrane normal.      Left Ear: Tympanic membrane normal.     Nose:     Comments: Nasal septal deviation noted.  Bilateral nares edematous and erythematous with clear nasal drainage noted with the right nare more edematous than the left.  Pharynx normal.  Ears normal.  Eyes normal.    Mouth/Throat:     Pharynx: Oropharynx is clear.  Eyes:     Conjunctiva/sclera: Conjunctivae normal.  Cardiovascular:     Rate and Rhythm: Normal rate and regular rhythm.     Heart sounds: Normal heart sounds. No murmur heard.   Pulmonary:     Effort: Pulmonary effort is normal.     Comments: Bilateral expiratory wheeze which partially cleared post bronchodilator therapy Musculoskeletal:        General: Normal range of motion.     Cervical back: Normal range of motion and neck supple.  Skin:    General: Skin is warm and dry.  Neurological:     Mental Status: He is alert and oriented to person, place, and time.  Psychiatric:        Mood and Affect: Mood normal.        Behavior: Behavior normal.        Thought Content: Thought content normal.        Judgment: Judgment normal.  Diagnostics: FVC 3.84, FEV1 2.12.  Predicted FVC 3.57, predicted FEV1 2.60.  Spirometry indicates moderate airway obstruction.  Postbronchodilator FVC 3.98, FEV1 2.59.  Postbronchodilator spirometry indicates airway obstruction with 22% improvement in FEV1  Assessment and Plan: 1. Moderate persistent asthma, unspecified whether complicated   2. Nasal polyps   3. Gastroesophageal reflux disease, unspecified whether esophagitis present     Meds ordered this encounter  Medications  . albuterol (VENTOLIN HFA) 108 (90 Base) MCG/ACT inhaler    Sig: Inhale 2 puffs into the lungs every 6 (six) hours as needed. For shortness of breath    Dispense:  18 g    Refill:  0    Patient Instructions  Asthma Begin Breztri 2 puffs twice a day with a spacer to prevent cough or wheeze. Let us know if you prefer Trellegy or Breztri Continue albuterol 2  puffs once every 4 hours as needed for cough or wheeze. Try to use albuterol only as needed  Nasal polyposis Continue Flonase 2 sprays in each nostril once a day Begin saline nasal rinses as needed for nasal symptoms. Use this before any medicated nasal sprays for best result Call the clinic if you are interested in beginning [place].   Reflux Dietary and lifestyle modifications as listed below  Call the clinic if this treatment plan is not working well for you  Follow up in 6 months or sooner if needed.   Return in about 6 months (around 07/15/2021), or if symptoms worsen or fail to improve.    Thank you for the opportunity to care for this patient.  Please do not hesitate to contact me with questions.  Gareth Morgan, FNP [facility]

## 2021-07-15 ENCOUNTER — Ambulatory Visit: Payer: Medicare Other | Admitting: Allergy

## 2021-07-15 ENCOUNTER — Telehealth: Payer: Self-pay | Admitting: Allergy

## 2021-07-15 ENCOUNTER — Other Ambulatory Visit: Payer: Self-pay | Admitting: *Deleted

## 2021-07-15 MED ORDER — ALBUTEROL SULFATE HFA 108 (90 BASE) MCG/ACT IN AERS: 2.0000 | INHALATION_SPRAY | Freq: Four times a day (QID) | RESPIRATORY_TRACT | 1 refills | Status: DC | PRN

## 2021-07-15 MED ORDER — VENTOLIN HFA 108 (90 BASE) MCG/ACT IN AERS: 2.0000 | INHALATION_SPRAY | RESPIRATORY_TRACT | 1 refills | Status: DC | PRN

## 2021-07-15 NOTE — Telephone Encounter (Signed)
Patient is requesting a refill on his Ventolin inhaler. He had his 6 month follow up set up for this afternoon (9/30) but had to be rescheduled due to our office closing early. He did reschedule for an OV with Chrissie on 10/3.

## 2021-07-15 NOTE — Telephone Encounter (Signed)
Refill sent into the pharmacy

## 2021-07-15 NOTE — Telephone Encounter (Signed)
Called patient and advised. Patient wanted to know if it will be the brand name and not generic because last time it was generic and was a smaller pump and did not work out. I refilled the prescription asking to dispense brand Ventolin for 18g inhaler. Patient verbalized understanding.

## 2021-07-16 NOTE — Patient Instructions (Addendum)
Asthma Increase Breztri 2 puffs twice a day with a spacer to prevent cough or wheeze. 2 samples of Breztri given. Recommend calling your insurance to see if Symbicort, Dulera, Advair or [place] Ruts is covered.  Please call our office and let us know what you find out so we can send in a prescription Continue albuterol 2 puffs once every 4 hours as needed for cough or wheeze. Try to use albuterol only as needed Recommend getting your eyes checked by your eye doctor due to the glazing symptom of your eyes.  Nasal polyposis Continue Flonase (fluticasone) 2 sprays in each nostril once a day You may use saline nasal rinses as needed for nasal symptoms. Use this before any medicated nasal sprays for best result Let us know if you are interested in starting [place]. This could possibly help with your asthma also.  Reflux Dietary and lifestyle modifications as listed below  Call the clinic if this treatment plan is not working well for you  Follow up in 6 weeks or sooner if needed.  Marland Kitchen

## 2021-07-18 ENCOUNTER — Encounter: Payer: Self-pay | Admitting: Family

## 2021-07-18 ENCOUNTER — Other Ambulatory Visit: Payer: Self-pay

## 2021-07-18 ENCOUNTER — Ambulatory Visit: Payer: Medicare Other | Admitting: Family

## 2021-07-18 VITALS — BP 140/72 | HR 89 | Temp 97.6°F | Resp 16 | Ht 67.0 in | Wt 208.6 lb

## 2021-07-18 DIAGNOSIS — J4540 Moderate persistent asthma, uncomplicated: Secondary | ICD-10-CM

## 2021-07-18 DIAGNOSIS — J339 Nasal polyp, unspecified: Secondary | ICD-10-CM | POA: Diagnosis not present

## 2021-07-18 DIAGNOSIS — K219 Gastro-esophageal reflux disease without esophagitis: Secondary | ICD-10-CM

## 2021-07-18 NOTE — Progress Notes (Signed)
Stanley Estrada [place] [address] Dept: [phone]  FOLLOW UP NOTE  Patient ID: Stanley Estrada, male    DOB: Mar 11, 1951  Age: 70 y.o. MRN: [id number] Date of Office Visit: 07/18/2021  Assessment  Chief Complaint: Follow-up (Patient in today for a follow up reports no new problems.)  HPI ESTABAN Estrada is a 69 year old male who presents today for follow-up of moderate persistent asthma, nasal polyps, and gastroesophageal reflux disease.  He was last seen on January 13, 2021 by Gareth Morgan, FNP.  Moderate persistent asthma is reported as not well controlled with Breztri 2 puffs once a day and albuterol as needed.  He reports that he has been out of albuterol for the past 2 to 3 weeks, but prior to that he was using his albuterol 1-2 times a day.  He reports a dry cough for the past couple days, wheezing at night, tightness in his chest when exerting himself and shortness of breath when exerting himself.  Since his last office visit he has not required any systemic steroids or made any trips to the emergency room or urgent care due to breathing problems.  He denies any fever or chills. He is only using his Breztri 2 puffs once a day because he does not like how it made his eyes feel glazed over and watery.  He denies any history of glaucoma and the last time he saw his eye doctor, that was several years ago, he had the beginning of cataracts.When he tried Trelegy in the past it caused him to have headaches.  He reports that his asthma medications are not covered by his insurance and that he did not feel like it was worth it to pay 200 some dollars for the [facility] that he has used in the past.  Nasal polyps is reported as not well controlled with Flonase 2 sprays each nostril once a day when he is not out of the medication.  He is not interested in starting Dupixent because of the side effects.  He is not interested in trying [place].  He reports that his fluticasone gets where it needs to  be.  He reports that he can taste fairly well ,but probably not as well as he should.  He can smell some things and not others.  He has not seen ear nose and throat in a good well but reports that there is no point in going.  He has had 2 nasal polyp surgeries, the most recent being in 2013.  He reports nasal congestion and denies rhinorrhea and postnasal drip.  He has not had any sinus infections since we last saw him.  Reflux is reported as not a problem.   Drug Allergies:  No Known Allergies  Review of Systems: Review of Systems  Constitutional:  Negative for chills and fever.  HENT:         Reports nasal congestion due to polyps.  Denies rhinorrhea and postnasal drip  Eyes:        Reports watery glazed eyes.  Denies itchy eyes  Respiratory:  Positive for cough, shortness of breath and wheezing.   Cardiovascular:  Negative for chest pain and palpitations.  Gastrointestinal:        Denies heartburn or reflux symptoms  Genitourinary:  Negative for frequency.  Skin:  Negative for itching and rash.  Neurological:  Negative for headaches.    Physical Exam: BP 140/72   Pulse 89   Temp 97.6 F (36.4 C) (  Temporal)   Resp 16   Ht 5\' 7"  (1.702 m)   Wt 208 lb 9.6 oz (94.6 kg)   SpO2 95%   BMI 32.67 kg/m    Physical Exam Constitutional:      Appearance: Normal appearance.  HENT:     Head: Normocephalic and atraumatic.     Comments: Pharynx normal, eyes normal, ears normal, nose: Bilateral lower turbinates moderately edematous and slightly erythematous with clear drainage noted.  Nasal polyp noted in left nostril    Right Ear: Tympanic membrane, ear canal and external ear normal.     Left Ear: Tympanic membrane and ear canal normal.     Mouth/Throat:     Mouth: Mucous membranes are moist.     Pharynx: Oropharynx is clear.  Eyes:     Conjunctiva/sclera: Conjunctivae normal.  Cardiovascular:     Rate and Rhythm: Normal rate and regular rhythm.     Heart sounds: Normal heart  sounds.  Pulmonary:     Effort: Pulmonary effort is normal.     Breath sounds: Normal breath sounds.     Comments: Lungs clear to auscultation Musculoskeletal:     Cervical back: Neck supple.  Skin:    General: Skin is warm.  Neurological:     Mental Status: He is alert and oriented to person, place, and time.  Psychiatric:        Mood and Affect: Mood normal.        Behavior: Behavior normal.        Thought Content: Thought content normal.        Judgment: Judgment normal.    Diagnostics: FVC 3.17 L, FEV1 2.10 L.  Predicted FVC 3.85 L, predicted FEV1 2.80 L.  Spirometry indicates mild airway obstruction.  Assessment and Plan: 1. Not well controlled moderate persistent asthma   2. Nasal polyps   3. Gastroesophageal reflux disease, unspecified whether esophagitis present     No orders of the defined types were placed in this encounter.   Patient Instructions  Asthma Increase Breztri 2 puffs twice a day with a spacer to prevent cough or wheeze. 2 samples of Breztri given. Recommend calling your insurance to see if Symbicort, Dulera, Advair or [place] Ruts is covered.  Please call our office and let us know what you find out so we can send in a prescription Continue albuterol 2 puffs once every 4 hours as needed for cough or wheeze. Try to use albuterol only as needed Recommend getting your eyes checked by your eye doctor due to the glazing symptom of your eyes.  Nasal polyposis Continue Flonase (fluticasone) 2 sprays in each nostril once a day You may use saline nasal rinses as needed for nasal symptoms. Use this before any medicated nasal sprays for best result Let us know if you are interested in starting [place]. This could possibly help with your asthma also.  Reflux Dietary and lifestyle modifications as listed below  Call the clinic if this treatment plan is not working well for you  Follow up in 6 weeks or sooner if needed.  .  Return in about 6 weeks (around  08/29/2021).    Thank you for the opportunity to care for this patient.  Please do not hesitate to contact me with questions.  Althea Charon, FNP [facility] [place]

## 2022-03-09 ENCOUNTER — Encounter: Payer: Self-pay | Admitting: Allergy

## 2022-03-09 ENCOUNTER — Ambulatory Visit: Payer: Medicare Other | Admitting: Allergy

## 2022-03-09 VITALS — BP 128/76 | HR 84 | Temp 97.9°F | Resp 16 | Ht 65.5 in | Wt 211.6 lb

## 2022-03-09 DIAGNOSIS — J4540 Moderate persistent asthma, uncomplicated: Secondary | ICD-10-CM | POA: Diagnosis not present

## 2022-03-09 DIAGNOSIS — K219 Gastro-esophageal reflux disease without esophagitis: Secondary | ICD-10-CM | POA: Diagnosis not present

## 2022-03-09 DIAGNOSIS — J339 Nasal polyp, unspecified: Secondary | ICD-10-CM

## 2022-03-09 MED ORDER — FLUTICASONE-SALMETEROL 250-50 MCG/ACT IN AEPB: 1.0000 | INHALATION_SPRAY | Freq: Two times a day (BID) | RESPIRATORY_TRACT | 5 refills | Status: DC

## 2022-03-09 NOTE — Progress Notes (Signed)
Follow-up Note  RE: Stanley Estrada MRN: [id number] DOB: 05-15-1951 Date of Office Visit: 03/09/2022   History of present illness: Stanley Estrada is a 71 y.o. male presenting today for follow-up of asthma, nasal polyposis and reflux.  He was last seen in the office on 07/18/21 by our nurse practitioner Stanley Estrada.    This spring he is noting more wheezing and needing to use albuterol more than usual.  He is currently out of any maintenance inhaler.  He was provided samples of Breztri last visit.  He states he prefers Theme park manager to [employer].  He also states the Wixela seemed to work the best.  However his insurance does not cover inhalers well to be affordable for him so he often goes without maintenance medications which is very unfortunate.  He is not interested in medication like Dupixent as he is concerned about the long-term side effects.   He does state he was able to cut the grass today without having difficulties with his breathing however this is not always the case.  He has not had an ED/UC visits for asthma or systemic steroids.    He states he can barely breathe though the nose due nasal polyps.  He does use flonase most days to help with this control.  Again not interested in biologic agents at this time for management.    Review of systems: Review of Systems  Constitutional: Negative.   HENT:  Positive for congestion.   Eyes: Negative.   Respiratory:  Positive for cough, shortness of breath and wheezing.   Cardiovascular: Negative.   Musculoskeletal: Negative.   Skin: Negative.   Allergic/Immunologic: Negative.   Neurological: Negative.     All other systems negative unless noted above in HPI  Past medical/social/surgical/family history have been reviewed and are unchanged unless specifically indicated below.  No changes  Medication List: Current Outpatient Medications  Medication Sig Dispense Refill   fluticasone (FLONASE) 50 MCG/ACT nasal spray Place 2 sprays into  both nostrils daily.     Fluticasone-Salmeterol (WIXELA INHUB) 100-50 MCG/DOSE AEPB Inhale 1 puff into the lungs 2 (two) times daily. 60 each 0   VENTOLIN HFA 108 (90 Base) MCG/ACT inhaler Inhale 2 puffs into the lungs every 4 (four) hours as needed for wheezing or shortness of breath. 18 g 1   No current facility-administered medications for this visit.     Known medication allergies: No Known Allergies   Physical examination: Blood pressure 128/76, pulse 84, temperature 97.9 F (36.6 C), resp. rate 16, height 5' 5.5" (1.664 m), weight 211 lb 9.6 oz (96 kg), SpO2 93 %.  General: Alert, interactive, in no acute distress. HEENT: PERRLA, TMs pearly gray, turbinates markedly edematous without discharge with visible , post-pharynx non erythematous. Neck: Supple without lymphadenopathy. Lungs: Mildly decreased breath sounds with expiratory wheezing bilaterally. {no increased work of breathing. CV: Normal S1, S2 without murmurs. Abdomen: Nondistended, nontender. Skin: Warm and dry, without lesions or rashes. Extremities:  No clubbing, cyanosis or edema. Neuro:   Grossly intact.  Diagnositics/Labs:  Spirometry: FEV1: 1.38L 51%, FVC: 3.25L 92% predicted.  Status post albuterol he had a 35% increase in FEV1 to 1.86 L or 68% predicted.  This is quite significant.  Assessment and plan: Asthma Wheezing on exam today.  Albuterol given in office.   Use Trelegy 255mg 1 inhalation daily until sample runs out.  Prescription for Wixela 2567m/'50mg'$  1 puff twice a day sent to your pharmacy.  Let usKoreanow if  you are not able to get this inhaler.  Have access to albuterol inhaler 2 puffs every 4-6 hours as needed for cough/wheeze/shortness of breath/chest tightness.  May use 15-20 minutes prior to activity.   Monitor frequency of use.    Asthma control goals:  Full participation in all desired activities (may need albuterol before activity) Albuterol use two time or less a week on average (not  counting use with activity) Cough interfering with sleep two time or less a month Oral steroids no more than once a year No hospitalizations   Nasal polyposis Discussed trial of Xhance 2 sprays each nostril 1-2 times a day for polyp control.  Sample provided today.  Xhance is fluticasone at a higher dose than Flonase.  It is able to be deposited deeper in the sinuses for better polyp control than standard Flonase.  Let us know if you find Truett Perna to work better than Flonase and will send in prescription.   You may use saline nasal rinses as needed for nasal symptoms. Use this before any medicated nasal sprays for best result We have discussed Dupixent again today which is an medication indicated for nasal polyp management as well as asthma management.  If you become interested in this option let us know.    Reflux Dietary and lifestyle modifications as listed below  Call the clinic if this treatment plan is not working well for you  Follow up in 3 months or sooner if needed.  I appreciate the opportunity to take part in Stanley Estrada's care. Please do not hesitate to contact me with questions.  Sincerely,   Prudy Feeler, MD Allergy/Immunology [facility]

## 2022-03-09 NOTE — Patient Instructions (Addendum)
Asthma Wheezing on exam today.  Albuterol given in office.   Use Trelegy 258mg 1 inhalation daily until sample runs out.  Prescription for Wixela 2543m/'50mg'$  1 puff twice a day sent to your pharmacy.  Let usKoreanow if you are not able to get this inhaler.  Have access to albuterol inhaler 2 puffs every 4-6 hours as needed for cough/wheeze/shortness of breath/chest tightness.  May use 15-20 minutes prior to activity.   Monitor frequency of use.    Asthma control goals:  Full participation in all desired activities (may need albuterol before activity) Albuterol use two time or less a week on average (not counting use with activity) Cough interfering with sleep two time or less a month Oral steroids no more than once a year No hospitalizations   Nasal polyposis Discussed trial of Xhance 2 sprays each nostril 1-2 times a day for polyp control.  Sample provided today.  Xhance is fluticasone at a higher dose than Flonase.  It is able to be deposited deeper in the sinuses for better polyp control than standard Flonase.  Let usKoreanow if you find XhTruett Pernao work better than Flonase and will send in prescription.   You may use saline nasal rinses as needed for nasal symptoms. Use this before any medicated nasal sprays for best result We have discussed Dupixent again today which is an medication indicated for nasal polyp management as well as asthma management.  If you become interested in this option let usKoreanow.    Reflux Dietary and lifestyle modifications as listed below  Call the clinic if this treatment plan is not working well for you  Follow up in 3 months or sooner if needed.  .Marland Kitchen

## 2022-05-19 ENCOUNTER — Ambulatory Visit: Payer: Medicare Other | Admitting: Allergy

## 2022-05-19 ENCOUNTER — Other Ambulatory Visit: Payer: Self-pay

## 2022-05-19 ENCOUNTER — Encounter: Payer: Self-pay | Admitting: Allergy

## 2022-05-19 VITALS — BP 124/82 | HR 80 | Resp 20

## 2022-05-19 DIAGNOSIS — J339 Nasal polyp, unspecified: Secondary | ICD-10-CM | POA: Diagnosis not present

## 2022-05-19 DIAGNOSIS — J4540 Moderate persistent asthma, uncomplicated: Secondary | ICD-10-CM | POA: Diagnosis not present

## 2022-05-19 MED ORDER — FLUTICASONE-SALMETEROL 250-50 MCG/ACT IN AEPB
INHALATION_SPRAY | RESPIRATORY_TRACT | 5 refills | Status: AC
Start: 2022-05-19 — End: ?

## 2022-05-19 MED ORDER — FLUTICASONE-SALMETEROL 250-50 MCG/ACT IN AEPB: 1.0000 | INHALATION_SPRAY | Freq: Two times a day (BID) | RESPIRATORY_TRACT | 5 refills | Status: DC

## 2022-05-19 NOTE — Progress Notes (Signed)
Follow-up Note  RE: Stanley Estrada MRN: [id number] DOB: [birth year] Date of Office Visit: 05/19/2022   History of present illness: Stanley Estrada is a 71 y.o. male presenting today for follow-up of asthma, nasal polyposis and reflux.  He was last seen in the office on 03/09/2022 by myself. He presents today as he has been needing to stretch the Trelegy sample that he has as it is about to run out soon.  He states Trelegy has been the most effective inhaler he has been on.  He states he was doing it every other day but now he is doing it every 2 to 3 days and still feels like it is controlling things even at that duration.  He has used Secondary school teacher in the past as that from a affordability standpoint has been also affordable with his insurance however he states his insurance does not really cover any of the inhalers well.  He is not interested in any biologic agent as he does not know what the long-term effects might be.  He reported today that concerns regarding the diabetes medications that recently became popular and how someone is having side effects related to that use.  He also states he has not gotten any of the COVID-vaccine and does not plan to the future. He states his nasal congestion is a bit worse.  He states he can still breathe a little bit through the nostrils but it is not as as good as it was both for the summer.  He has history of nasal polyps.  He did try using the Xhance device and states once he got the hang of using it he does feel it was effective.  We did provide a sample last time.  He states he had to get used to going into the device and pushing on the device at the same time.  He also still has his Flonase.  He is not interested in further surgery as he is already had tubes in the past.  Again I have talked with him about Dupixent as a way to better control his nasal polyps but he is not interested.  Review of systems: Review of Systems  Constitutional: Negative.   HENT:   Positive for congestion.   Eyes: Negative.   Respiratory:  Positive for shortness of breath and wheezing.   Cardiovascular: Negative.   Musculoskeletal: Negative.   Skin: Negative.   Allergic/Immunologic: Negative.   Neurological: Negative.      All other systems negative unless noted above in HPI  Past medical/social/surgical/family history have been reviewed and are unchanged unless specifically indicated below.  No changes  Medication List: Current Outpatient Medications  Medication Sig Dispense Refill   fluticasone (FLONASE) 50 MCG/ACT nasal spray Place 2 sprays into both nostrils daily.     fluticasone-salmeterol (ADVAIR) 250-50 MCG/ACT AEPB Inhale one puff twice daily to prevent cough or wheeze. Rinse mouth after use. 60 each 5   VENTOLIN HFA 108 (90 Base) MCG/ACT inhaler Inhale 2 puffs into the lungs every 4 (four) hours as needed for wheezing or shortness of breath. 18 g 1   No current facility-administered medications for this visit.     Known medication allergies: No Known Allergies   Physical examination: Blood pressure 124/82, pulse 80, resp. rate 20, SpO2 95 %.  General: Alert, interactive, in no acute distress. HEENT: PERRLA, TMs pearly gray, turbinates moderately edematous without discharge with visible large shiny polyp above the inferior turbinate of the left  nostril, post-pharynx non erythematous. Neck: Supple without lymphadenopathy. Lungs: Expiratory wheeze of the left upper lobe and right lower lobe otherwise clear fields . {no increased work of breathing. CV: Normal S1, S2 without murmurs. Abdomen: Nondistended, nontender. Skin: Warm and dry, without lesions or rashes. Extremities:  No clubbing, cyanosis or edema. Neuro:   Grossly intact.  Diagnositics/Labs: Spirometry: FEV1 1.89 L 69%, FVC 3.76 L 104% predicted.  Restrictive pattern  Assessment and plan:   Asthma, not well controlled Wheezing on exam today most likely due to being out of inhaler  therapy Prefer you use Trelegy 260mg 1 puff daily as your maintenance inhaler.   We have provided with samples of Trelegy 100 (we do not have the 200s currently) you can use 1 puff daily to help with your breathing control. Use the Trelegy samples until you run out. We called your pharmacy and was told Advair discus 2553m 1 puff twice a day would be at no cost to you.  Thus I have sent this in as a prescription to use once his Trelegy samples run out. I have advised him to let usKoreanow as we likely can provide further Trelegy samples in the future.  Have access to albuterol inhaler 2 puffs every 4-6 hours as needed for cough/wheeze/shortness of breath/chest tightness.  May use 15-20 minutes prior to activity.   Monitor frequency of use.    Asthma control goals:  Full participation in all desired activities (may need albuterol before activity) Albuterol use two time or less a week on average (not counting use with activity) Cough interfering with sleep two time or less a month Oral steroids no more than once a year No hospitalizations   Nasal polyposis Continue Xhance 2 sprays each nostril 1-2 times a day for polyp control.  Xhance is fluticasone at a higher dose than Flonase.   Take prednisone '40mg'$  (4 tabs) daily for next 5 days to help shrink your polyps which will help your Xhance reach deeper in to the sinus to control polpys better You may use saline nasal rinses as needed for nasal symptoms. Use this before any medicated nasal sprays for best result We have discussed Dupixent again today which is an medication indicated for nasal polyp management as well as asthma management.  If you become interested in this option let usKoreanow.    Reflux Dietary and lifestyle modifications as listed below  Call the clinic if this treatment plan is not working well for you  Follow up in 4-6 months or sooner if needed.  I appreciate the opportunity to take part in Stanley Estrada's care. Please do not  hesitate to contact me with questions.  Sincerely,   Stanley FeelerMD Allergy/Immunology [facility]

## 2022-05-19 NOTE — Patient Instructions (Addendum)
Asthma Wheezing on exam today most likely due to being out of inhaler therapy Prefer you use Trelegy 251mg 1 puff daily as your maintenance inhaler.   We have provided with samples of Trelegy 100 (we do not have the 200s currently) you can use 1 puff daily to help with your breathing control. Use the Trelegy samples until you run out. We called your pharmacy and was told Advair discus 2554m 1 puff twice a day would be at no cost to you.    Have access to albuterol inhaler 2 puffs every 4-6 hours as needed for cough/wheeze/shortness of breath/chest tightness.  May use 15-20 minutes prior to activity.   Monitor frequency of use.    Asthma control goals:  Full participation in all desired activities (may need albuterol before activity) Albuterol use two time or less a week on average (not counting use with activity) Cough interfering with sleep two time or less a month Oral steroids no more than once a year No hospitalizations   Nasal polyposis Continue Xhance 2 sprays each nostril 1-2 times a day for polyp control.  Xhance is fluticasone at a higher dose than Flonase.   Take prednisone '40mg'$  (4 tabs) daily for next 5 days to help shrink you polyps which will help your Xhance reach deeper in to the sinus to control polpys better You may use saline nasal rinses as needed for nasal symptoms. Use this before any medicated nasal sprays for best result We have discussed Dupixent again today which is an medication indicated for nasal polyp management as well as asthma management.  If you become interested in this option let usKoreanow.    Reflux Dietary and lifestyle modifications as listed below  Call the clinic if this treatment plan is not working well for you  Follow up in 4-6 months or sooner if needed.  .Marland Kitchen

## 2023-01-25 DIAGNOSIS — K080 Exfoliation of teeth due to systemic causes: Secondary | ICD-10-CM | POA: Diagnosis not present

## 2023-01-30 ENCOUNTER — Telehealth: Payer: Self-pay

## 2023-01-30 ENCOUNTER — Telehealth: Payer: Self-pay | Admitting: Allergy

## 2023-01-30 MED ORDER — VENTOLIN HFA 108 (90 BASE) MCG/ACT IN AERS: 2.0000 | INHALATION_SPRAY | RESPIRATORY_TRACT | 0 refills | Status: DC | PRN

## 2023-01-30 NOTE — Telephone Encounter (Signed)
Informed patient of medication sent into the pharmacy

## 2023-01-30 NOTE — Telephone Encounter (Signed)
Patient called and stated he wanted his Ventolin refilled. Patient wants it sent over at [facility].

## 2023-01-30 NOTE — Addendum Note (Signed)
Addended by: Florence Canner on: 01/30/2023 12:03 PM   Modules accepted: Orders

## 2023-01-30 NOTE — Telephone Encounter (Signed)
Courtesy refill sent into the pharmacy. Patient has an appointment scheduled.  [place] [phone]

## 2023-02-07 ENCOUNTER — Ambulatory Visit: Payer: Medicare Other | Admitting: Allergy

## 2023-02-07 ENCOUNTER — Other Ambulatory Visit: Payer: Self-pay

## 2023-02-07 ENCOUNTER — Encounter: Payer: Self-pay | Admitting: Allergy

## 2023-02-07 VITALS — BP 132/80 | HR 72 | Temp 97.9°F | Resp 18 | Wt 210.3 lb

## 2023-02-07 DIAGNOSIS — J339 Nasal polyp, unspecified: Secondary | ICD-10-CM | POA: Diagnosis not present

## 2023-02-07 DIAGNOSIS — K219 Gastro-esophageal reflux disease without esophagitis: Secondary | ICD-10-CM | POA: Diagnosis not present

## 2023-02-07 DIAGNOSIS — J4540 Moderate persistent asthma, uncomplicated: Secondary | ICD-10-CM

## 2023-02-07 MED ORDER — TRELEGY ELLIPTA 200-62.5-25 MCG/ACT IN AEPB
1.0000 | INHALATION_SPRAY | Freq: Every day | RESPIRATORY_TRACT | 5 refills | Status: AC
Start: 2023-02-07 — End: ?

## 2023-02-07 MED ORDER — XHANCE 93 MCG/ACT NA EXHU: 2.0000 | INHALANT_SUSPENSION | Freq: Two times a day (BID) | NASAL | 1 refills | Status: AC

## 2023-02-07 MED ORDER — XHANCE 93 MCG/ACT NA EXHU: 2.0000 | INHALANT_SUSPENSION | Freq: Two times a day (BID) | NASAL | 1 refills | Status: DC

## 2023-02-07 NOTE — Patient Instructions (Addendum)
Asthma Use Trelegy 1 puff daily.   This is the preferred inhaler for your asthma control.    If Trelegy is $45 copay or less or the cost is affordable this is the inhaler I want you to be using instead of the generic Advair  Have access to albuterol inhaler 2 puffs every 4-6 hours as needed for cough/wheeze/shortness of breath/chest tightness.  May use 15-20 minutes prior to activity.   Monitor frequency of use.    Asthma control goals:  Full participation in all desired activities (may need albuterol before activity) Albuterol use two time or less a week on average (not counting use with activity) Cough interfering with sleep two time or less a month Oral steroids no more than once a year No hospitalizations   Nasal polyposis Use Xhance 2 sprays each nostril 1-2 times a day for polyp control.   You may use saline nasal rinses as needed for nasal symptoms. Use this before any medicated nasal sprays for best result We have discussed Dupixent in the past which is an medication indicated for nasal polyp management as well as asthma management.  If you become interested in this option let us know.    Reflux Dietary and lifestyle modifications as listed below  Follow up in 6 months or sooner if needed.  Marland Kitchen

## 2023-02-07 NOTE — Progress Notes (Signed)
Follow-up Note  RE: Stanley Estrada MRN: [id number] DOB: May 16, 1951 Date of Office Visit: 02/07/2023   History of present illness: Stanley Estrada is a 72 y.o. male presenting today for follow-up of asthma, nasal polyposis and reflux.  He was last seen in the office on 05/19/2022 by myself. He does report having lower extremity swelling of his feet since the last visit.  The swelling has seemed to be around the time where he had a lot of hand congestion over several days.  He stopped eating in the hand and has not really reduced the amount of meetings in the diet.  The swelling did resolve.  He was wanted to make sure the swelling was not related to any of his medications. In regards to his asthma he has used up all of the Trelegy samples that we have provided.  He states with his insurance this year that the generic Advair that used to be $0 is now +40 $5 this year.  He states that this is co-pay price.  He does feel that the Trelegy is more effective than the generic Advair.  He notes with the generic Advair he needs to use his albuterol inhaler more often.  He also states he needs to use the Advair on a daily basis to see benefit.  He states he can go 2 to 3 days between Trelegy without having symptoms before getting to dose again.  He fortunately has not had any ED or urgent care visits or any systemic steroid needs. With his nasal polyps he still cannot breathe well through the nose.  He does report Timmothy Sours has been most effective option for his nasal polyp control.  He is not interested in Dupixent or any other biologic agent at this time.  Review of systems: Review of Systems  Constitutional: Negative.   HENT:  Positive for congestion.   Eyes: Negative.   Respiratory:  Positive for wheezing.   Cardiovascular: Negative.   Musculoskeletal: Negative.   Skin: Negative.   Allergic/Immunologic: Negative.   Neurological: Negative.      All other systems negative unless noted above in  HPI  Past medical/social/surgical/family history have been reviewed and are unchanged unless specifically indicated below.  No changes  Medication List: Current Outpatient Medications  Medication Sig Dispense Refill   fluticasone (FLONASE) 50 MCG/ACT nasal spray Place 2 sprays into both nostrils daily.     fluticasone-salmeterol (ADVAIR) 250-50 MCG/ACT AEPB Inhale one puff twice daily to prevent cough or wheeze. Rinse mouth after use. 60 each 5   VENTOLIN HFA 108 (90 Base) MCG/ACT inhaler Inhale 2 puffs into the lungs every 4 (four) hours as needed for wheezing or shortness of breath. 18 g 0   No current facility-administered medications for this visit.     Known medication allergies: No Known Allergies   Physical examination: Blood pressure 132/80, pulse 72, temperature 97.9 F (36.6 C), temperature source Temporal, resp. rate 18, weight 210 lb 4.8 oz (95.4 kg), SpO2 96 %.  General: Alert, interactive, in no acute distress. HEENT: PERRLA, TMs pearly gray, turbinates moderately edematous without discharge with polypoid tissue visible in the left nostril around the middle turbinate, post-pharynx non erythematous. Neck: Supple without lymphadenopathy. Lungs: Clear to auscultation without wheezing, rhonchi or rales. {no increased work of breathing. CV: Normal S1, S2 without murmurs. Abdomen: Nondistended, nontender. Skin: Warm and dry, without lesions or rashes. Extremities:  No clubbing, cyanosis or edema. Neuro:   Grossly intact.  Diagnositics/Labs:  Spirometry: FEV1: 2.26L , FVC: 3.8L  , ratio consistent with 59%, obstructive pattern  Assessment and plan: Asthma Use Trelegy 1 puff daily.   This is the preferred inhaler for your asthma control.    If Trelegy is $45 copay or less or the cost is affordable this is the inhaler I want you to be using instead of the generic Advair  Have access to albuterol inhaler 2 puffs every 4-6 hours as needed for cough/wheeze/shortness  of breath/chest tightness.  May use 15-20 minutes prior to activity.   Monitor frequency of use.    Asthma control goals:  Full participation in all desired activities (may need albuterol before activity) Albuterol use two time or less a week on average (not counting use with activity) Cough interfering with sleep two time or less a month Oral steroids no more than once a year No hospitalizations   Nasal polyposis Use Xhance 2 sprays each nostril 1-2 times a day for polyp control.   You may use saline nasal rinses as needed for nasal symptoms. Use this before any medicated nasal sprays for best result We have discussed Dupixent in the past which is an medication indicated for nasal polyp management as well as asthma management.  If you become interested in this option let us know.    Reflux Dietary and lifestyle modifications as listed below  Follow up in 6 months or sooner if needed.  I appreciate the opportunity to take part in Arby's care. Please do not hesitate to contact me with questions.  Sincerely,   Margo Aye, MD Allergy/Immunology [facility] [place]

## 2023-02-13 DIAGNOSIS — K080 Exfoliation of teeth due to systemic causes: Secondary | ICD-10-CM | POA: Diagnosis not present

## 2023-07-27 DIAGNOSIS — K080 Exfoliation of teeth due to systemic causes: Secondary | ICD-10-CM | POA: Diagnosis not present

## 2024-01-01 MED ORDER — VENTOLIN HFA 108 (90 BASE) MCG/ACT IN AERS
2.0000 | INHALATION_SPRAY | RESPIRATORY_TRACT | 0 refills | Status: AC | PRN
Start: 2024-01-01 — End: ?

## 2024-01-01 NOTE — Telephone Encounter (Signed)
 Called and informed patient of the refill and confirmed upcoming appointment. Patient verbalized understanding and agreed to pick up his inhaler.

## 2024-01-01 NOTE — Addendum Note (Signed)
 Addended by: Ralene Muskrat on: 01/01/2024 06:46 PM   Modules accepted: Orders

## 2024-01-01 NOTE — Telephone Encounter (Signed)
 Patient called stating he needs a refill on his Ventolin Inhaler sent to [facility].

## 2024-01-30 DIAGNOSIS — K080 Exfoliation of teeth due to systemic causes: Secondary | ICD-10-CM | POA: Diagnosis not present

## 2024-02-14 ENCOUNTER — Other Ambulatory Visit: Payer: Self-pay

## 2024-02-14 ENCOUNTER — Ambulatory Visit: Admitting: Allergy

## 2024-02-14 ENCOUNTER — Encounter: Payer: Self-pay | Admitting: Allergy

## 2024-02-14 VITALS — BP 120/86 | HR 77 | Temp 98.2°F | Ht 66.0 in | Wt 197.2 lb

## 2024-02-14 DIAGNOSIS — J339 Nasal polyp, unspecified: Secondary | ICD-10-CM

## 2024-02-14 DIAGNOSIS — J4540 Moderate persistent asthma, uncomplicated: Secondary | ICD-10-CM | POA: Diagnosis not present

## 2024-02-14 DIAGNOSIS — K219 Gastro-esophageal reflux disease without esophagitis: Secondary | ICD-10-CM | POA: Diagnosis not present

## 2024-02-14 MED ORDER — FLUTICASONE-SALMETEROL 230-21 MCG/ACT IN AERO: 2.0000 | INHALATION_SPRAY | Freq: Two times a day (BID) | RESPIRATORY_TRACT | 11 refills | Status: AC

## 2024-02-14 NOTE — Patient Instructions (Addendum)
 Asthma Use Advair 2 puffs twice a day for maintenance of symptoms.  This will replace use of Trelegy due to insurance cover/cost and effectiveness. Have access to albuterol  inhaler 2 puffs every 4-6 hours as needed for cough/wheeze/shortness of breath/chest tightness.  May use 15-20 minutes prior to activity.   Monitor frequency of use.    Asthma control goals:  Full participation in all desired activities (may need albuterol  before activity) Albuterol  use two time or less a week on average (not counting use with activity) Cough interfering with sleep two time or less a month Oral steroids no more than once a year No hospitalizations   Nasal polyposis Use Flonase  2 sprays each nostril 1-2 times a day for help with polyp control.   Has used Xhance  previously as well.  We have discussed Dupixent in the past which is an medication indicated for nasal polyp management as well as asthma management.  If you become interested in this option let us  know. Can try use of Astepro nasal spray to help with nasal drainage control. You may use saline nasal rinses as needed for nasal symptoms. Use this before any medicated nasal sprays for best result    Reflux Dietary and lifestyle modifications as listed below  Follow up in 6-12 months or sooner if needed.  Aaron Aas

## 2024-02-14 NOTE — Progress Notes (Unsigned)
 Follow-up Note  RE: Stanley Estrada MRN: [id number] DOB: 1951/02/04 Date of Office Visit: 02/14/2024   History of present illness: Stanley Estrada is a 73 y.o. male presenting today for follow-up of asthma, nasal polyposis, reflux.  He was last seen in the office on 02/07/23 by myself.   Discussed the use of AI scribe software for clinical note transcription with the patient, who gave verbal consent to proceed.  His asthma has been stable over the past year. He has not used his rescue inhaler for several months until he recently got a refill.  When he has albuterol  available he uses daily on average, often prophylactically before outdoor activities. He went several months without using it at all. He is concerned about the cost of his current medication, Trelegy, which is intolerable $585.90, with a copay of $45. His drugstore is not getting fully reimbursed by Medicare, leading them to stop selling it. He would like to go back to generic Advair due to cost concerns.  He does note occasional wheezing, particularly at night.   He has nasal polyps causing significant congestion and that also affects his smell. He had a past experience where a doctor removed a polyp in-office, which he found preferable to surgery. He continues to use Flonase, which he can obtain over the counter, and is considering trying Astepro for mucus control.  He has been gluten-free for three years, which has helped him lose weight and improve his overall health. He notes a weight loss of 15 pounds since starting the diet. No recent hospitalizations or new diagnoses of any medical issues since last visit.      Review of systems: 10pt ROS negative unless noted above in HPI   Past medical/social/surgical/family history have been reviewed and are unchanged unless specifically indicated below.  No changes  Medication List: Current Outpatient Medications  Medication Sig Dispense Refill   fluticasone  (FLONASE) 50  MCG/ACT nasal spray Place 2 sprays into both nostrils daily.     Fluticasone -Umeclidin-Vilant (TRELEGY ELLIPTA ) 200-62.5-25 MCG/ACT AEPB Inhale 1 puff into the lungs daily. 28 each 5   VENTOLIN  HFA 108 (90 Base) MCG/ACT inhaler Inhale 2 puffs into the lungs every 4 (four) hours as needed for wheezing or shortness of breath. 18 g 0   Fluticasone  Propionate (XHANCE ) 93 MCG/ACT EXHU Place 2 sprays into the nose 2 (two) times daily. (Patient not taking: Reported on 02/14/2024) 32 mL 1   fluticasone -salmeterol (ADVAIR) 250-50 MCG/ACT AEPB Inhale one puff twice daily to prevent cough or wheeze. Rinse mouth after use. (Patient not taking: Reported on 02/14/2024) 60 each 5   No current facility-administered medications for this visit.     Known medication allergies: No Known Allergies   Physical examination: Blood pressure 120/86, pulse 77, temperature 98.2 F (36.8 C), temperature source Temporal, height 5\' 6"  (1.676 m), weight 197 lb 3.2 oz (89.4 kg), SpO2 94%.  General: Alert, interactive, in no acute distress. HEENT: PERRLA, TMs pearly gray, turbinates  large polyp on left nostril, enlarged turbinates b/l  with clear discharge, post-pharynx non erythematous. Neck: Supple without lymphadenopathy. Lungs: Clear to auscultation without wheezing, rhonchi or rales. {no increased work of breathing. CV: Normal S1, S2 without murmurs. Abdomen: Nondistended, nontender. Skin: Warm and dry, without lesions or rashes. Extremities:  No clubbing, cyanosis or edema. Neuro:   Grossly intact.  Diagnositics/Labs: Spirometry: FEV1: 2.37L 88%, FVC: 4.02L 113%, ratio consistent with nonbstructive pattern  Assessment and plan:   Asthma Use Advair  2 puffs twice a day for maintenance of symptoms.  This will replace use of Trelegy due to insurance cover/cost and effectiveness. Have access to albuterol  inhaler 2 puffs every 4-6 hours as needed for cough/wheeze/shortness of breath/chest tightness.  May use 15-20  minutes prior to activity.   Monitor frequency of use.    Asthma control goals:  Full participation in all desired activities (may need albuterol  before activity) Albuterol  use two time or less a week on average (not counting use with activity) Cough interfering with sleep two time or less a month Oral steroids no more than once a year No hospitalizations   Nasal polyposis Use Flonase  2 sprays each nostril 1-2 times a day for help with polyp control.   Has used Xhance  previously as well.  We have discussed Dupixent in the past which is an medication indicated for nasal polyp management as well as asthma management.  If you become interested in this option let us  know. Can try use of Astepro nasal spray to help with nasal drainage control. You may use saline nasal rinses as needed for nasal symptoms. Use this before any medicated nasal sprays for best result    Reflux Dietary and lifestyle modifications as listed below  Follow up in 6-12 months or sooner if needed.  I appreciate the opportunity to take part in Stanley Estrada's care. Please do not hesitate to contact me with questions.  Sincerely,   Catha Clink, MD Allergy/Immunology [facility]

## 2024-02-15 ENCOUNTER — Other Ambulatory Visit (HOSPITAL_COMMUNITY): Payer: Self-pay

## 2024-02-15 ENCOUNTER — Telehealth: Payer: Self-pay

## 2024-02-15 NOTE — Telephone Encounter (Signed)
*  Asthma/Allergy  Pharmacy Patient Advocate Encounter   Received notification from CoverMyMeds that prior authorization for Fluticasone -Salmeterol 230-21MCG/ACT aerosol  is required/requested.   Insurance verification completed.   The patient is insured through [facility] .   Per test claim:  Brand Advair HFA is preferred by the insurance.  If suggested medication is appropriate, Please send in a new RX and discontinue this one. If not, please advise as to why it's not appropriate so that we may request a Prior Authorization. Please note, some preferred medications may still require a PA.  If the suggested medications have not been trialed and there are no contraindications to their use, the PA will not be submitted, as it will not be approved.

## 2024-02-19 NOTE — Telephone Encounter (Addendum)
 Per PA Team:  Brand Advair HFA is preferred- patient is under multiple phases of their plan making the copay $368.93    Called patient - DOB/NEED DPR verified - advised of the above notation from PA Team. Patient advised to contact his insurance to see what other alternatives and most cost effective for him then contact office.  Patient verbalized understanding to all, no further questions.  Forwarding updated message to provider.

## 2024-02-19 NOTE — Telephone Encounter (Signed)
 Per Provider:  So he would have to pay $363 out of pocket cost?     He can not do that.  He complained about the $45 copay for Trelegy however reports his pharmacy is losing money on talking Trelegy and will no longer have this available.  Patient wanted to go back to Advair as he believes this would be more cost effective.  Please send in the brand Advair if that will be a co-pay of $45 or less so that he can afford it.

## 2024-02-20 ENCOUNTER — Other Ambulatory Visit (HOSPITAL_COMMUNITY): Payer: Self-pay

## 2024-02-20 NOTE — Telephone Encounter (Signed)
 Looks like AFTER he meets his deductible- the inhalers will be $45.00. The Advair HFA is currently filled which is preventing me from providing an accurate cost for any alternatives.

## 2024-02-20 NOTE — Telephone Encounter (Signed)
 Patient will need to take this up with his plan- he has a deductible and other phases to get through before his prices go down. Deductibles re-set every year.

## 2024-02-21 NOTE — Telephone Encounter (Signed)
 Spoke with the patient and let him know that price would not decrease for advair until deductible has been met with insurance. Verbalized understanding.
